# Patient Record
Sex: Female | Born: 1960 | Hispanic: Yes
Health system: Midwestern US, Community
[De-identification: ages and names within clinical notes are randomized; demographics above are authoritative.]

## PROBLEM LIST (undated history)

## (undated) DIAGNOSIS — Z1231 Encounter for screening mammogram for malignant neoplasm of breast: Secondary | ICD-10-CM

## (undated) DIAGNOSIS — Z1239 Encounter for other screening for malignant neoplasm of breast: Secondary | ICD-10-CM

## (undated) DIAGNOSIS — M899 Disorder of bone, unspecified: Secondary | ICD-10-CM

## (undated) DIAGNOSIS — M25511 Pain in right shoulder: Secondary | ICD-10-CM

## (undated) DIAGNOSIS — M2550 Pain in unspecified joint: Secondary | ICD-10-CM

## (undated) DIAGNOSIS — M654 Radial styloid tenosynovitis [de Quervain]: Secondary | ICD-10-CM

## (undated) DIAGNOSIS — G5601 Carpal tunnel syndrome, right upper limb: Secondary | ICD-10-CM

## (undated) DIAGNOSIS — R635 Abnormal weight gain: Secondary | ICD-10-CM

## (undated) DIAGNOSIS — M545 Low back pain, unspecified: Secondary | ICD-10-CM

## (undated) DIAGNOSIS — M24811 Other specific joint derangements of right shoulder, not elsewhere classified: Secondary | ICD-10-CM

## (undated) DIAGNOSIS — N6310 Unspecified lump in the right breast, unspecified quadrant: Secondary | ICD-10-CM

## (undated) DIAGNOSIS — R609 Edema, unspecified: Secondary | ICD-10-CM

---

## 2008-05-16 LAB — CHLAMYDIA/GC PCR
Chlamydia amplified: NEGATIVE
N. gonorrhea, amplified: NEGATIVE

## 2008-07-25 LAB — CHLAMYDIA/GC PCR
Chlamydia amplified: NEGATIVE
N. gonorrhea, amplified: NEGATIVE

## 2008-07-26 LAB — HSV CULTURE WITHOUT TYPING

## 2009-07-04 LAB — CULTURE, URINE
Culture result:: <10000
Culture: <10000

## 2009-07-18 LAB — CULTURE, URINE
Culture result:: 75000
Culture: 75000

## 2009-12-31 LAB — LIPID PANEL
CHOL/HDL Ratio: 2.6 (ref 0–5.0)
Cholesterol, total: 227 MG/DL — ABNORMAL HIGH (ref 0–200)
HDL Cholesterol: 86 MG/DL — ABNORMAL HIGH (ref 40–60)
LDL, calculated: 134.2 MG/DL — ABNORMAL HIGH (ref 0–100)
Triglyceride: 34 MG/DL (ref 0–150)
VLDL, calculated: 6.8 MG/DL

## 2010-12-24 LAB — LIPID PANEL
CHOL/HDL Ratio: 3.1 (ref 0–5.0)
Cholesterol, total: 203 MG/DL — ABNORMAL HIGH (ref 0–200)
HDL Cholesterol: 66 MG/DL — ABNORMAL HIGH (ref 40–60)
LDL, calculated: 125.6 MG/DL — ABNORMAL HIGH (ref 0–100)
Triglyceride: 57 MG/DL (ref 0–150)
VLDL, calculated: 11.4 MG/DL

## 2010-12-24 LAB — METABOLIC PANEL, COMPREHENSIVE
A-G Ratio: 1.1 (ref 0.8–1.7)
ALT (SGPT): 31 U/L (ref 30–65)
AST (SGOT): 12 U/L — ABNORMAL LOW (ref 15–37)
Albumin: 3.7 g/dL (ref 3.4–5.0)
Alk. phosphatase: 85 U/L (ref 50–136)
Anion gap: 6 mmol/L (ref 5–15)
BUN/Creatinine ratio: 13 (ref 12–20)
BUN: 9 MG/DL (ref 7–18)
Bilirubin, total: 0.8 MG/DL (ref 0.2–1.0)
CO2: 27 MMOL/L (ref 21–32)
Calcium: 8.9 MG/DL (ref 8.4–10.4)
Chloride: 107 MMOL/L (ref 100–108)
Creatinine: 0.7 MG/DL (ref 0.6–1.3)
GFR est AA: >60 mL/min/{1.73_m2} (ref >60)
GFR est non-AA: >60 mL/min/{1.73_m2} (ref >60)
Globulin: 3.4 g/dL (ref 2.0–4.0)
Glucose: 90 MG/DL (ref 74–99)
Potassium: 4.4 MMOL/L (ref 3.5–5.5)
Protein, total: 7.1 g/dL (ref 6.4–8.2)
Sodium: 140 MMOL/L (ref 136–145)

## 2010-12-24 LAB — TSH 3RD GENERATION: TSH: 0.51 u[IU]/mL (ref 0.51–6.27)

## 2010-12-24 LAB — CK: CK: 77 U/L (ref 26–192)

## 2013-08-03 ENCOUNTER — Encounter

## 2014-12-20 ENCOUNTER — Inpatient Hospital Stay: Admit: 2014-12-20 | Primary: Internal Medicine

## 2014-12-20 LAB — LIPID PANEL
CHOL/HDL Ratio: 2.9 Ratio (ref 0.0–4.4)
Cholesterol, total: 203 mg/dl — ABNORMAL HIGH (ref 140–199)
HDL Cholesterol: 70 mg/dl (ref 40–96)
LDL, calculated: 122 mg/dl (ref 0–130)
Triglyceride: 54 mg/dl (ref 29–150)

## 2014-12-20 LAB — GLUCOSE, RANDOM: Glucose: 83 mg/dl (ref 74–106)

## 2015-03-28 ENCOUNTER — Inpatient Hospital Stay: Payer: BLUE CROSS/BLUE SHIELD

## 2015-03-28 MED ORDER — FENTANYL CITRATE (PF) 50 MCG/ML IJ SOLN
50 mcg/mL | INTRAMUSCULAR | Status: DC | PRN
Start: 2015-03-28 — End: 2015-03-28

## 2015-03-28 MED ORDER — FENTANYL CITRATE (PF) 50 MCG/ML IJ SOLN
50 mcg/mL | INTRAMUSCULAR | Status: DC | PRN
Start: 2015-03-28 — End: 2015-03-28
  Administered 2015-03-28 (×2): via INTRAVENOUS

## 2015-03-28 MED ORDER — MEPERIDINE (PF) 25 MG/ML INJ SOLUTION
25 mg/ml | INTRAMUSCULAR | Status: DC | PRN
Start: 2015-03-28 — End: 2015-03-28

## 2015-03-28 MED ORDER — HYDROCODONE-ACETAMINOPHEN 5 MG-325 MG TAB
5-325 mg | ORAL | Status: DC | PRN
Start: 2015-03-28 — End: 2015-03-28

## 2015-03-28 MED ORDER — EPHEDRINE SULFATE 50 MG/ML IJ SOLN
50 mg/mL | INTRAMUSCULAR | Status: DC | PRN
Start: 2015-03-28 — End: 2015-03-28
  Administered 2015-03-28: 12:00:00 via INTRAVENOUS

## 2015-03-28 MED ORDER — OXYCODONE 5 MG TAB
5 mg | ORAL | Status: DC | PRN
Start: 2015-03-28 — End: 2015-03-28

## 2015-03-28 MED ORDER — LABETALOL 5 MG/ML IV SOLN
5 mg/mL | INTRAVENOUS | Status: DC | PRN
Start: 2015-03-28 — End: 2015-03-28

## 2015-03-28 MED ORDER — PROMETHAZINE 25 MG/ML INJECTION
25 mg/mL | INTRAMUSCULAR | Status: DC | PRN
Start: 2015-03-28 — End: 2015-03-28

## 2015-03-28 MED ORDER — OXYCODONE-ACETAMINOPHEN 5 MG-325 MG TAB
5-325 mg | ORAL | Status: DC | PRN
Start: 2015-03-28 — End: 2015-03-28
  Administered 2015-03-28: 13:00:00 via ORAL

## 2015-03-28 MED ORDER — HYDRALAZINE 20 MG/ML IJ SOLN
20 mg/mL | INTRAMUSCULAR | Status: DC | PRN
Start: 2015-03-28 — End: 2015-03-28

## 2015-03-28 MED ORDER — ONDANSETRON (PF) 4 MG/2 ML INJECTION
4 mg/2 mL | INTRAMUSCULAR | Status: DC | PRN
Start: 2015-03-28 — End: 2015-03-28
  Administered 2015-03-28: 12:00:00 via INTRAVENOUS

## 2015-03-28 MED ORDER — ROPIVACAINE (PF) 5 MG/ML (0.5 %) INJECTION
5 mg/mL (0. %) | INTRAMUSCULAR | Status: DC | PRN
Start: 2015-03-28 — End: 2015-03-28
  Administered 2015-03-28: 12:00:00

## 2015-03-28 MED ORDER — LACTATED RINGERS IV
INTRAVENOUS | Status: DC
Start: 2015-03-28 — End: 2015-03-28
  Administered 2015-03-28 (×2): via INTRAVENOUS

## 2015-03-28 MED ORDER — HYDROMORPHONE (PF) 1 MG/ML IJ SOLN
1 mg/mL | INTRAMUSCULAR | Status: DC | PRN
Start: 2015-03-28 — End: 2015-03-28

## 2015-03-28 MED ORDER — PROPOFOL 10 MG/ML IV EMUL
10 mg/mL | INTRAVENOUS | Status: DC | PRN
Start: 2015-03-28 — End: 2015-03-28
  Administered 2015-03-28: 12:00:00 via INTRAVENOUS

## 2015-03-28 MED FILL — LABETALOL 5 MG/ML IV SOLN: 5 mg/mL | INTRAVENOUS | Qty: 2

## 2015-03-28 MED FILL — HYDROMORPHONE (PF) 1 MG/ML IJ SOLN: 1 mg/mL | INTRAMUSCULAR | Qty: 0.5

## 2015-03-28 MED FILL — HYDROCODONE-ACETAMINOPHEN 5 MG-325 MG TAB: 5-325 mg | ORAL | Qty: 1

## 2015-03-28 MED FILL — FENTANYL CITRATE (PF) 50 MCG/ML IJ SOLN: 50 mcg/mL | INTRAMUSCULAR | Qty: 0.5

## 2015-03-28 MED FILL — OXYCODONE 5 MG TAB: 5 mg | ORAL | Qty: 1

## 2015-03-28 MED FILL — LACTATED RINGERS IV: INTRAVENOUS | Qty: 1000

## 2015-03-28 MED FILL — OXYCODONE-ACETAMINOPHEN 5 MG-325 MG TAB: 5-325 mg | ORAL | Qty: 1

## 2015-03-28 MED FILL — DEMEROL (PF) 25 MG/ML INJECTION SYRINGE: 25 mg/mL | INTRAMUSCULAR | Qty: 1

## 2015-03-28 MED FILL — HYDRALAZINE 20 MG/ML IJ SOLN: 20 mg/mL | INTRAMUSCULAR | Qty: 0.25

## 2015-03-28 MED FILL — PROMETHAZINE 25 MG/ML INJECTION: 25 mg/mL | INTRAMUSCULAR | Qty: 0.25

## 2015-03-28 NOTE — Anesthesia Pre-Procedure Evaluation (Signed)
Anesthetic History   No history of anesthetic complications            Review of Systems / Medical History  Patient summary reviewed, nursing notes reviewed and pertinent labs reviewed    Pulmonary  Within defined limits                 Neuro/Psych   Within defined limits           Cardiovascular  Within defined limits                     GI/Hepatic/Renal  Within defined limits              Endo/Other  Within defined limits           Other Findings              Physical Exam    Airway  Mallampati: I    Neck ROM: normal range of motion   Mouth opening: Normal     Cardiovascular  Regular rate and rhythm,  S1 and S2 normal,  no murmur, click, rub, or gallop             Dental  No notable dental hx       Pulmonary  Breath sounds clear to auscultation               Abdominal  GI exam deferred       Other Findings            Anesthetic Plan    ASA: 1  Anesthesia type: general          Induction: Intravenous  Anesthetic plan and risks discussed with: Patient

## 2015-03-28 NOTE — Brief Op Note (Signed)
BRIEF OPERATIVE NOTE    Date of Procedure: 03/28/2015   Preoperative Diagnosis: Worsening Bilateral Carpal Tunnel Syndrome   Postoperative Diagnosis: Worsening Bilateral Carpal Tunnel Syndrome     Procedure(s):  BILATERAL CARPAL TUNNEL RELEASE  Surgeon(s) and Role:     Carter Kitten., MD - Primary            Surgical Staff:  Circ-1: Manley Mason. Pati Gallo, RN  Scrub Tech-1: Joaquim Lai  Surgical Tech First Assistant: Everlene Balls  Event Time In   Incision Start [phone]   Incision Close      Anesthesia: General   Estimated Blood Loss:minimal  Specimens: none   Complications:none  Implants:

## 2015-03-28 NOTE — Op Note (Signed)
[facility]  Ambulatory Surgery  NAME:  Katherine Keller, Katherine Keller  SEX:   F  SURGERY DATE: 03/28/2015  DOB: 1961-01-11  MR#    [id number]  LOCATION: PACU  ACCT#  [id number]        PREOPERATIVE DIAGNOSIS:  Steadily worsening bilateral carpal tunnel syndrome.    POSTOPERATIVE DIAGNOSIS:  Steadily worsening bilateral carpal tunnel syndrome.    OPERATION PERFORMED:  Bilateral carpal tunnel release with open technique.    SURGEON:  Venetia Constable, M.D.    ANESTHESIA:  General.    SKIN PREPARATION:  Betadine.    DESCRIPTION OF PROCEDURE:  With the patient in the supine position under satisfactory level of general   anesthesia, both upper extremities were prepped and draped as separate sterile   fields.  Standard time-out protocol was observed and all in attendance agree   with the operative plan.  Accordingly, both wrists injected with 0.5%   ropivacaine for postoperative analgesia.  Bilateral palmar incisions are made   2.5 cm in length with a #15 blade scalpel.  Dissection is carried out with 3.5   power loupe magnification to disclose the distal margin of the transverse   carpal ligament.  This ligament is dissected with Freer periosteal dissector   to release it from overlying fascia.  The Paganelli knife is now inserted and   its notch, engaged in the transverse carpal ligament with the guard resting   over the median nerve complex.  The Paganelli knife is now advanced   proximally, and audible and palpable release of the transverse carpal ligament   is noted.  Tourniquet was released and meticulous hemostasis obtained with   bipolar cautery.  Incisions closed with interrupted 3-0 nylon, bulky sterile   dressing applied.  The patient is now awakened for transport to the recovery   room in good condition.  There are no apparent complications.  Estimated blood   loss was minimal.  There are no implants and no specimens.      ___________________  Colleen Can MD  Dictated By:.   NS  D:03/28/2015 08:46:00   T: 03/28/2015 10:23:48  [id number]

## 2015-03-28 NOTE — Anesthesia Post-Procedure Evaluation (Signed)
Post-Anesthesia Evaluation and Assessment    Patient: Katherine Keller MRN: [id number]  SSN: [id number]    Date of Birth: 1961-01-20  Age: 54 y.o.  Sex: female       Cardiovascular Function/Vital Signs  Visit Vitals   ??? BP 95/51   ??? Pulse 81   ??? Temp 36.6 ??C (97.8 ??F)   ??? Resp 16   ??? Ht  (1.626 m)   ??? Wt 81.6 kg (180 lb)   ??? SpO2 94%   ??? BMI 30.9 kg/m2       Patient is status post general anesthesia for Procedure(s):  BILATERAL CARPAL TUNNEL RELEASE.    Nausea/Vomiting: None    Postoperative hydration reviewed and adequate.    Pain:  Pain Scale 1: Numeric (0 - 10) (03/28/15 0655)  Pain Intensity 1: 6 (03/28/15 2841)   Managed    Neurological Status:       At baseline    Mental Status and Level of Consciousness: Arousable    Pulmonary Status:   O2 Device: Room air (03/28/15 3244)   Adequate oxygenation and airway patent    Complications related to anesthesia: None    Post-anesthesia assessment completed. No concerns    Signed By: Thelma Comp, MD     March 28, 2015

## 2015-03-28 NOTE — Op Note (Signed)
[facility]  Ambulatory Surgery  NAME:  Katherine Keller, Katherine Keller  SEX:   F  SURGERY DATE: 03/28/2015  DOB: [birth year]  MR#    [id number]  LOCATION: PACU  ACCT#  [id number]        PREOPERATIVE DIAGNOSIS:  Steadily worsening bilateral carpal tunnel syndrome.    POSTOPERATIVE DIAGNOSIS:  Steadily worsening bilateral carpal tunnel syndrome.    OPERATION PERFORMED:  Bilateral carpal tunnel release with open technique.    SURGEON:  Venetia Constable, M.D.    ANESTHESIA:  General.    SKIN PREPARATION:  Betadine.    DESCRIPTION OF PROCEDURE:  With the patient in the supine position under satisfactory level of general   anesthesia, both upper extremities were prepped and draped as separate sterile   fields.  Standard time-out protocol was observed and all in attendance agree   with the operative plan.  Accordingly, both wrists injected with 0.5%   ropivacaine for postoperative analgesia.  Bilateral palmar incisions are made   2.5 cm in length with a #15 blade scalpel.  Dissection is carried out with 3.5   power loupe magnification to disclose the distal margin of the transverse   carpal ligament.  This ligament is dissected with Freer periosteal dissector   to release it from overlying fascia.  The Paganelli knife is now inserted and   its notch, engaged in the transverse carpal ligament with the guard resting   over the median nerve complex.  The Paganelli knife is now advanced   proximally, and audible and palpable release of the transverse carpal ligament   is noted.  Tourniquet was released and meticulous hemostasis obtained with   bipolar cautery.  Incisions closed with interrupted 3-0 nylon, bulky sterile   dressing applied.  The patient is now awakened for transport to the recovery   room in good condition.  There are no apparent complications.  Estimated blood   loss was minimal.  There are no implants and no specimens.      ___________________  Colleen Can MD  Dictated By:.   NS  D:03/28/2015  08:46:00  T: 03/28/2015 10:23:48  [id number]

## 2015-05-01 ENCOUNTER — Encounter

## 2015-05-01 ENCOUNTER — Inpatient Hospital Stay: Admit: 2015-05-01 | Payer: BLUE CROSS/BLUE SHIELD | Primary: Internal Medicine

## 2015-05-01 DIAGNOSIS — Z1231 Encounter for screening mammogram for malignant neoplasm of breast: Secondary | ICD-10-CM

## 2015-12-24 ENCOUNTER — Inpatient Hospital Stay: Admit: 2015-12-24 | Payer: BLUE CROSS/BLUE SHIELD | Primary: Internal Medicine

## 2015-12-24 ENCOUNTER — Inpatient Hospital Stay: Admit: 2015-12-24 | Primary: Internal Medicine

## 2015-12-24 DIAGNOSIS — Z0000 Encounter for general adult medical examination without abnormal findings: Secondary | ICD-10-CM

## 2015-12-24 LAB — METABOLIC PANEL, COMPREHENSIVE
ALT (SGPT): 23 U/L (ref 12–78)
AST (SGOT): 15 U/L (ref 15–37)
Albumin: 3.8 gm/dl (ref 3.4–5.0)
Alk. phosphatase: 70 U/L (ref 45–117)
BUN: 13 mg/dl (ref 7–25)
Bilirubin, total: 1 mg/dl (ref 0.2–1.0)
CO2: 28 mEq/L (ref 21–32)
Calcium: 8.9 mg/dl (ref 8.5–10.1)
Chloride: 105 mEq/L (ref 98–107)
Creatinine: 0.6 mg/dl (ref 0.6–1.3)
GFR est AA: >60
GFR est non-AA: >60
Glucose: 71 mg/dl — ABNORMAL LOW (ref 74–106)
Potassium: 4 mEq/L (ref 3.5–5.1)
Protein, total: 7.2 gm/dl (ref 6.4–8.2)
Sodium: 143 mEq/L (ref 136–145)

## 2015-12-24 LAB — CBC WITH AUTOMATED DIFF
BASOPHILS: 0.4 % (ref 0–3)
EOSINOPHILS: 2 % (ref 0–5)
HCT: 40.3 % (ref 37.0–50.0)
HGB: 12.5 gm/dl — ABNORMAL LOW (ref 13.0–17.2)
IMMATURE GRANULOCYTES: 0.4 % (ref 0.0–3.0)
LYMPHOCYTES: 18.7 % — ABNORMAL LOW (ref 28–48)
MCH: 25.6 pg (ref 25.4–34.6)
MCHC: 31 gm/dl (ref 30.0–36.0)
MCV: 82.6 fL (ref 80.0–98.0)
MONOCYTES: 6.8 % (ref 1–13)
MPV: 10.9 fL — ABNORMAL HIGH (ref 6.0–10.0)
NEUTROPHILS: 71.7 % — ABNORMAL HIGH (ref 34–64)
NRBC: 0 (ref 0–0)
PLATELET: 297 10*3/uL (ref 140–450)
RBC: 4.88 M/uL (ref 3.60–5.20)
RDW-SD: 39.8 (ref 36.4–46.3)
WBC: 7.6 10*3/uL (ref 4.0–11.0)

## 2015-12-24 LAB — LIPID PANEL
CHOL/HDL Ratio: 2.4 Ratio (ref 0.0–4.4)
Cholesterol, total: 189 mg/dl (ref 140–199)
HDL Cholesterol: 79 mg/dl (ref 40–96)
LDL, calculated: 101 mg/dl (ref 0–130)
Triglyceride: 47 mg/dl (ref 29–150)

## 2015-12-24 LAB — TSH 3RD GENERATION: TSH: 1.61 u[IU]/mL (ref 0.358–3.740)

## 2015-12-24 LAB — VITAMIN D, 25 HYDROXY: Vitamin D, 25-OH, Total: 29.7 ng/ml — ABNORMAL LOW (ref 30.0–100.0)

## 2015-12-24 LAB — HEMOGLOBIN A1C W/O EAG: Hemoglobin A1c: 5.6 % (ref 4.8–6.0)

## 2016-09-07 ENCOUNTER — Inpatient Hospital Stay: Admit: 2016-09-07 | Payer: BLUE CROSS/BLUE SHIELD | Primary: Internal Medicine

## 2016-09-07 ENCOUNTER — Encounter

## 2016-09-07 DIAGNOSIS — Z1231 Encounter for screening mammogram for malignant neoplasm of breast: Secondary | ICD-10-CM

## 2016-12-16 ENCOUNTER — Inpatient Hospital Stay: Admit: 2016-12-16 | Primary: Internal Medicine

## 2016-12-16 LAB — LIPID PANEL
CHOL/HDL Ratio: 2.7 Ratio (ref 0.0–4.4)
Cholesterol, total: 199 mg/dl (ref 140–199)
HDL Cholesterol: 74 mg/dl (ref 40–96)
LDL, calculated: 114 mg/dl (ref 0–130)
Triglyceride: 55 mg/dl (ref 29–150)

## 2016-12-16 LAB — HEMOGLOBIN A1C W/O EAG: Hemoglobin A1c: 5.7 % (ref 4.2–6.3)

## 2017-01-20 ENCOUNTER — Inpatient Hospital Stay: Admit: 2017-01-20 | Payer: BLUE CROSS/BLUE SHIELD | Attending: Internal Medicine | Primary: Internal Medicine

## 2017-01-20 ENCOUNTER — Encounter

## 2017-01-20 ENCOUNTER — Inpatient Hospital Stay: Admit: 2017-01-20 | Discharge: 2017-01-20 | Payer: BLUE CROSS/BLUE SHIELD | Primary: Internal Medicine

## 2017-01-20 DIAGNOSIS — M545 Low back pain, unspecified: Secondary | ICD-10-CM

## 2017-01-20 LAB — METABOLIC PANEL, COMPREHENSIVE
ALT (SGPT): 25 U/L (ref 12–78)
AST (SGOT): 17 U/L (ref 15–37)
Albumin: 3.9 gm/dl (ref 3.4–5.0)
Alk. phosphatase: 83 U/L (ref 45–117)
Anion gap: 8 mmol/L (ref 5–15)
BUN: 19 mg/dl (ref 7–25)
Bilirubin, total: 1 mg/dl (ref 0.2–1.0)
CO2: 26 mEq/L (ref 21–32)
Calcium: 9.2 mg/dl (ref 8.5–10.1)
Chloride: 108 mEq/L — ABNORMAL HIGH (ref 98–107)
Creatinine: 0.9 mg/dl (ref 0.6–1.3)
GFR est AA: >60
GFR est non-AA: >60
Glucose: 88 mg/dl (ref 74–106)
Potassium: 4 mEq/L (ref 3.5–5.1)
Protein, total: 7.7 gm/dl (ref 6.4–8.2)
Sodium: 142 mEq/L (ref 136–145)

## 2017-01-20 LAB — CBC WITH AUTOMATED DIFF
BASOPHILS: 0.4 % (ref 0–3)
EOSINOPHILS: 2.5 % (ref 0–5)
HCT: 39.6 % (ref 37.0–50.0)
HGB: 12.6 gm/dl — ABNORMAL LOW (ref 13.0–17.2)
IMMATURE GRANULOCYTES: 0.2 % (ref 0.0–3.0)
LYMPHOCYTES: 38.1 % (ref 28–48)
MCH: 25.6 pg (ref 25.4–34.6)
MCHC: 31.8 gm/dl (ref 30.0–36.0)
MCV: 80.5 fL (ref 80.0–98.0)
MONOCYTES: 7.8 % (ref 1–13)
MPV: 10.7 fL — ABNORMAL HIGH (ref 6.0–10.0)
NEUTROPHILS: 51 % (ref 34–64)
NRBC: 0 (ref 0–0)
PLATELET: 352 10*3/uL (ref 140–450)
RBC: 4.92 M/uL (ref 3.60–5.20)
RDW-SD: 42.2 (ref 36.4–46.3)
WBC: 5.6 10*3/uL (ref 4.0–11.0)

## 2017-01-20 LAB — URINALYSIS W/ RFLX MICROSCOPIC
Bilirubin: NEGATIVE
Blood: NEGATIVE
Glucose: NEGATIVE mg/dl
Ketone: NEGATIVE mg/dl
Leukocyte Esterase: NEGATIVE
Nitrites: NEGATIVE
Protein: NEGATIVE mg/dl
Specific gravity: >=1.03 (ref 1.005–1.030)
Urobilinogen: 0.2 mg/dl (ref 0.0–1.0)
pH (UA): 5.5 (ref 5.0–9.0)

## 2017-01-20 LAB — LIPID PANEL
CHOL/HDL Ratio: 3.1 Ratio (ref 0.0–4.4)
Cholesterol, total: 225 mg/dl — ABNORMAL HIGH (ref 140–199)
HDL Cholesterol: 73 mg/dl (ref 40–96)
LDL, calculated: 117 mg/dl (ref 0–130)
Triglyceride: 176 mg/dl — ABNORMAL HIGH (ref 29–150)

## 2017-01-20 LAB — TSH 3RD GENERATION: TSH: 0.465 u[IU]/mL (ref 0.358–3.740)

## 2017-01-20 LAB — T4, FREE: Free T4: 0.84 ng/dl (ref 0.76–1.46)

## 2017-01-20 LAB — VITAMIN D, 25 HYDROXY: Vitamin D, 25-OH, Total: 25.9 ng/ml — ABNORMAL LOW (ref 30.0–100.0)

## 2017-01-20 LAB — HEMOGLOBIN A1C W/O EAG: Hemoglobin A1c: 5.6 % (ref 4.2–6.3)

## 2017-04-27 ENCOUNTER — Inpatient Hospital Stay: Admit: 2017-04-27 | Discharge: 2017-04-27 | Payer: BLUE CROSS/BLUE SHIELD | Primary: Internal Medicine

## 2017-04-27 DIAGNOSIS — M899 Disorder of bone, unspecified: Secondary | ICD-10-CM

## 2017-04-27 LAB — CBC WITH AUTOMATED DIFF
BASOPHILS: 0.5 % (ref 0–3)
EOSINOPHILS: 1.8 % (ref 0–5)
HCT: 38.5 % (ref 37.0–50.0)
HGB: 12.2 gm/dl — ABNORMAL LOW (ref 13.0–17.2)
IMMATURE GRANULOCYTES: 0.3 % (ref 0.0–3.0)
LYMPHOCYTES: 35 % (ref 28–48)
MCH: 25.4 pg (ref 25.4–34.6)
MCHC: 31.7 gm/dl (ref 30.0–36.0)
MCV: 80.2 fL (ref 80.0–98.0)
MONOCYTES: 7.9 % (ref 1–13)
MPV: 10.5 fL — ABNORMAL HIGH (ref 6.0–10.0)
NEUTROPHILS: 54.5 % (ref 34–64)
NRBC: 0 (ref 0–0)
PLATELET: 327 10*3/uL (ref 140–450)
RBC: 4.8 M/uL (ref 3.60–5.20)
RDW-SD: 39.8 (ref 36.4–46.3)
WBC: 6.1 10*3/uL (ref 4.0–11.0)

## 2017-04-28 ENCOUNTER — Encounter

## 2017-04-28 LAB — URINALYSIS W/ RFLX MICROSCOPIC
Bilirubin: NEGATIVE
Blood: NEGATIVE
Glucose: NEGATIVE mg/dl
Ketone: NEGATIVE mg/dl
Leukocyte Esterase: NEGATIVE
Nitrites: NEGATIVE
Protein: NEGATIVE mg/dl
Specific gravity: 1.015 (ref 1.005–1.030)
Urobilinogen: 0.2 mg/dl (ref 0.0–1.0)
pH (UA): 6 (ref 5.0–9.0)

## 2017-04-28 LAB — METABOLIC PANEL, COMPREHENSIVE
ALT (SGPT): 22 U/L (ref 12–78)
AST (SGOT): 12 U/L — ABNORMAL LOW (ref 15–37)
Albumin: 3.7 gm/dl (ref 3.4–5.0)
Alk. phosphatase: 83 U/L (ref 45–117)
Anion gap: 7 mmol/L (ref 5–15)
BUN: 20 mg/dl (ref 7–25)
Bilirubin, total: 0.5 mg/dl (ref 0.2–1.0)
CO2: 26 mEq/L (ref 21–32)
Calcium: 9.2 mg/dl (ref 8.5–10.1)
Chloride: 106 mEq/L (ref 98–107)
Creatinine: 0.6 mg/dl (ref 0.6–1.3)
GFR est AA: >60
GFR est non-AA: >60
Glucose: 82 mg/dl (ref 74–106)
Potassium: 3.6 mEq/L (ref 3.5–5.1)
Protein, total: 7.2 gm/dl (ref 6.4–8.2)
Sodium: 139 mEq/L (ref 136–145)

## 2017-04-28 LAB — SED RATE (ESR): Sed rate (ESR): 10 mm/Hr (ref 0–30)

## 2017-04-28 LAB — C REACTIVE PROTEIN, QT: C-Reactive protein: 3.3 mg/L — ABNORMAL HIGH (ref 0.0–2.9)

## 2017-05-01 LAB — PROTEIN ELECTROPHORESIS, URINE RANDOM
Abnormal Protein Band 1: NOT DETECTED
Albumin, urine: 100 %
Alpha-1-Globulin, urine: 0 %
Alpha-2-Globulin, urine: 0 %
Beta-globulin, urine: 0 %
Creatinine, urine random: 39 mg/dL (ref 20–275)
Gamma Globulin, urine: 0 %
Protein, urine random: 4 mg/dL — ABNORMAL LOW (ref 5–24)
Protein/Creat. urine Ratio: 103 mg/g creat (ref 21–161)

## 2017-05-01 LAB — PROTEIN ELECTROPHORESIS
ALPHA-2 GLOBULIN: 0.6 g/dL (ref 0.5–0.9)
Abnormal Protein Band 1: NOT DETECTED
Albumin: 3.9 g/dL (ref 3.8–4.8)
Alpha-1-globulin: 0.3 g/dL (ref 0.2–0.3)
Beta 1 Globulin: 0.5 g/dL (ref 0.4–0.6)
Beta 2 Globulin: 0.3 g/dL (ref 0.2–0.5)
Gamma globulin: 1 g/dL (ref 0.8–1.7)
Interpretation (see below): NORMAL
Protein, total: 6.6 g/dL (ref 6.1–8.1)

## 2017-05-03 ENCOUNTER — Inpatient Hospital Stay: Admit: 2017-05-03 | Payer: BLUE CROSS/BLUE SHIELD | Attending: Orthopaedic Surgery | Primary: Internal Medicine

## 2017-05-03 ENCOUNTER — Encounter

## 2017-05-03 DIAGNOSIS — M899 Disorder of bone, unspecified: Secondary | ICD-10-CM

## 2017-05-03 MED ORDER — GADOPENTETATE DIMEGLUMINE 469.01 MG/ML (46.9 %) IV SOLN
469.01 mg/mL (46.9 %) | Freq: Once | INTRAVENOUS | Status: AC
Start: 2017-05-03 — End: 2017-05-03
  Administered 2017-05-03: 15:00:00 via INTRAVENOUS

## 2017-05-03 MED ORDER — TECHNETIUM TC 99M MEDRONATE IV KIT
Freq: Once | Status: AC
Start: 2017-05-03 — End: 2017-05-03
  Administered 2017-05-03: 14:00:00 via INTRAVENOUS

## 2017-05-03 MED FILL — MAGNEVIST 469.01 MG/ML (46.9 %) INTRAVENOUS SOLUTION: 469.01 mg/mL (46.9 %) | INTRAVENOUS | Qty: 18

## 2017-05-12 ENCOUNTER — Encounter: Primary: Internal Medicine

## 2017-05-18 ENCOUNTER — Inpatient Hospital Stay: Admit: 2017-05-18 | Payer: BLUE CROSS/BLUE SHIELD | Primary: Internal Medicine

## 2017-05-18 DIAGNOSIS — M25511 Pain in right shoulder: Secondary | ICD-10-CM

## 2017-05-18 NOTE — Progress Notes (Signed)
[address], [place]  [place], [place] [id number]  [phone]  __________________________________________________________  OUTPATIENT PHYSICAL THERAPYINITIAL EVALUATION    Patient: Katherine DikeRosalinda Keller       Age: 56 y.o.        DOB: 1961/04/08        MRN: [id number]  Date: 05/18/2017      Referring Physician: Neomia Glassatherine Rapp,  MD   Diagnosis: Impingement syndrome of unspecified shoulder [M75.40]   Treatment Diagnosis: Right Shoulder Pain   Plan of Care:  Theraputic Exercise, Moist Heat/Cryotherapy, Iontophoresis and Teaching of a HEP    Frequency/Duration: 2 times/week for 6 weeks for a total of 12 visits     I certify the need for these services furnished under this plan of treatment and while under my care.         Signature: _________________________________ Date: _________________               Neomia Glassatherine Rapp, MD       PLEASE SIGN AND RETURN VIA FAX TO [phone]     SUBJECTIVE:   History of present illness: Patient reports a gradual onset of right shoulder pain beginning in June. She denies any specific trauma or injury to her shoulder. Pain has become constant and increases with overhead reaching.   Pain Scale Rating:  5/10  Medications: Reviewed in chart.  Allergies: Sulfa  Precautions:  None stated on the prescription.  Past Medical History: Unremarkable  Occupation/Social History: Dietary  Previous Physical Therapy:  None reported  Patient's Goal:  Decrease pain    OBJECTIVE:   Range of Motion: Right shoulder AROM currently measures: 146 degrees of flexion, 128 degrees of abduction, 55 degrees of internal rotation and 68 degrees of external rotation. All motion is limited by pain and guarding..  Strength:  Right: RTC 4-/5, Deltoids 4/5, Biceps 4+/5, Triceps 4+/5    Girth: Absent distal upper extremity swelling.    Palpation: Tenderness is present with palpation over the posterior aspect of the right shoulder, rated 1-2/4 on a myofascial tender point index.     Posture: Right shoulder girdle slightly elevated.     Sensation: Intact to gross touch distal upper extremity dermatomes.    Special Tests: Impingement Tests (+), Drop Arm/Empty Can (-)    TODAY'S TREATMENT:     Evaluation followed by: Theraputic Exercise, Moist Heat and Teaching of a HEP to Decrease Pain, Increase Soft Tissue Extensibility, Increase Strength and Improve ROM    Patient educated on the purpose of Physical Therapy and the plan of care as it pertains to her condition.  ASSESSMENT:     Patient presents with right shoulder pain limiting ROM, strength and functional use of the right upper extremity.  GOALS:      To be achieved in 12 visits.   1. Full pain-free right shoulder AROM.   2. 5/5 right upper extremity strength to allow for return to daily activities.   3. Absent tenderness throughout the right shoulder girdle.   4. Demonstrate improved postural awareness.   5. Patient to be independent with their HEP.  Rehab Potential: Good to meet the above stated goals.    Barriers to achieving goals: None    PLAN:     Skilled Physical Therapy services will modify and progress therapeutic interventions, address functional deficits, address ROM and strength deficits, analyze and address soft tissue restrictions, analyze and cue movement patterns and assess and modify postural abnormalities to reach the stated goals.  Thank you for this referral.  Judeth Porch, PT.DPT

## 2017-05-25 ENCOUNTER — Encounter: Payer: BLUE CROSS/BLUE SHIELD | Primary: Internal Medicine

## 2017-06-28 NOTE — Progress Notes (Signed)
[address], [place]  [place], [place] [id number]  ______________________________________________________________________________________    06/28/2017    To: Neomia Glassatherine Rapp, MD  Re:   Katherine Keller    Katherine DikeRosalinda Keller was seen in our clinic on 05/18/2017 (01 visit) for evaluation and treatment of right shoulder pain.    Patient has not returned for her remaining visits and has been discharged from our services.    If you have any questions, please feel free to contact me at [phone].      Sincerely,      ____________________________  R. Bard HerbertMichael McCombs, PT, DPT

## 2017-07-28 ENCOUNTER — Encounter

## 2017-08-26 ENCOUNTER — Encounter

## 2017-09-12 ENCOUNTER — Inpatient Hospital Stay: Admit: 2017-09-12 | Payer: BLUE CROSS/BLUE SHIELD | Primary: Internal Medicine

## 2017-09-12 DIAGNOSIS — Z713 Dietary counseling and surveillance: Secondary | ICD-10-CM

## 2017-09-12 NOTE — Progress Notes (Signed)
Nutrition Counseling for Obesity       Ht:    65.0 in  Wt:   185.4 lb  Body mass index is 30.85 kg/m??.    Katherine Keller is a 57 y.o. female seen today for nutrition counseling for weight loss and healthy eating.      Past Medical History includes:  Past Medical History:   Diagnosis Date   ??? Arthritis      Current Medications:  Current Outpatient Medications   Medication Sig Dispense Refill   ??? black cohosh 540 mg cap Take  by mouth.     ??? cholecalciferol (VITAMIN D3) 1,000 unit tablet Take  by mouth daily.     ??? traMADol (ULTRAM) 50 mg tablet Take 50 mg by mouth two (2) times a day. Indications: PAIN          Katherine Keller is here for the [facility] program. She doesn't smoke or consumes alcohol.      Katherine Keller consumes most of her meals from the cafeteria here at the hospital. She will also eat out with the family when she is off in the evenings. She typically works 6 days per week. She will snack on fruit or Nutri Grain bars at home. Katherine Keller typically eats 2-3 meals and 2 snacks per day. Some days due to her working environment she grazes instead of having a meal.       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       Introduced Katherine Keller to the [employer]ible Meal Plan for Weight Loss and the Plate Method for Meal Planning and provided written materials.  Practiced planning meals using food models to demonstrate use of the Plate Method and to visualize appropriate serving sizes.   Food records were reviewed at today's visit and suggestions were made for improvement. Benefits of keeping detailed daily food records for long term weight loss success were discussed.      Katherine Keller is motivated to make lifestyle changes to improve her overall health and help her to lose weight.  She set several goals and these are attached for reference.       Katherine Keller was a pleasure to work with today.  She has our contact information for questions as needed and is scheduled for a follow-up visit in April     Katherine Keller Katherine Conley, MS, RDN, CSOWM, CDE    Katherine Keller's Weight Loss Goals:    Eat 3 meals per day, focusing on whole grains, fruits, vegetables, lean meats, healthy fats and low sodium foods.  Eliminate sugar sweetened beverages-check Vitamin Water  Limit intake of sugar and fat from snack foods-limit sweets and chips  Avoid fatty, fried, fast foods and processed foods  Reduce entree servings and add veggies at lunch and dinner  Increase intake fiber from whole grains  Increase intake of non-starchy vegetables  Increase intake of fresh fruit   Keep food logs for next 30 days to be discussed with dietitian on next visit  Add strength training 2-3 days per week

## 2017-10-10 ENCOUNTER — Inpatient Hospital Stay: Admit: 2017-10-10 | Payer: BLUE CROSS/BLUE SHIELD | Primary: Internal Medicine

## 2017-10-10 DIAGNOSIS — E669 Obesity, unspecified: Secondary | ICD-10-CM

## 2017-10-10 NOTE — Progress Notes (Addendum)
Nutrition Counseling for Obesity   Why Weight Visit 2 of 4    Ht:    65.0 in  Wt:   185.2 lb  Body mass index is 30.82 kg/m??.    Katherine Keller is a 57 y.o. female seen today for nutrition counseling for weight loss and healthy eating.      Past Medical History includes:  Past Medical History:   Diagnosis Date   ??? Arthritis          Current Medications:  Current Outpatient Medications   Medication Sig Dispense Refill   ??? naproxen (NAPROSYN) 250 mg tablet Take  by mouth as needed.     ??? black cohosh 540 mg cap Take  by mouth.     ??? cholecalciferol (VITAMIN D3) 1,000 unit tablet Take  by mouth daily.     ??? traMADol (ULTRAM) 50 mg tablet Take 50 mg by mouth two (2) times a day. Indications: PAIN             Katherine Keller returned today for a follow-up nutrition appointment. Her weight is stable over the last month.   She kept food records for the last 4 weeks. Unfortunately she had a virus that kept her in bed for 5-6 days which caused a set back.   Her diet has changed since her last visit. She has almost eliminated dessert type products and all of her beverages are zero calorie. She has also cut back on fast food.   Katherine Keller continues to skip meals and frequently eats large dinner meals due to her work schedule.   She admits to tremendous on the job stress and realizes skipping meals is not good for her health or weight loss.     We discussed meals and snacks that she can incorporate in to he current life schedule to prevent missed meals. We reviewed her food logs and made recommendations for improvements. Reviewed the Flexible Meal Plan for Weight Loss and provided written material.      She will return to exercising when she has her energy back after the virus.    Katherine Keller continues to be motivated to make lifestyle changes to improve her overall health and help her to lose weight.  We reviewed her previously set goals and these are attached for reference.       Katherine Keller was a pleasure to work with today.  She has our contact information for questions as needed and is scheduled for a follow-up visit in 4 weeks.     Katherine Barbararacy Hatfield Darrio Bade, MS, RDN, CSOWM, CDE    Katherine Keller's Weight Loss Goals:  ??  Eat 3 meals per day, focusing on whole grains, fruits, vegetables, lean meats, healthy fats and low sodium foods.  Eliminate sugar sweetened beverages-check Vitamin Water  Limit intake of sugar and fat from snack foods-limit sweets and chips  Avoid fatty, fried, fast foods and processed foods  Reduce entree servings and add veggies at lunch and dinner  Increase intake fiber from whole grains  Increase intake of non-starchy vegetables  Increase intake of fresh fruit   Keep food logs for next 30 days to be discussed with dietitian on next visit  Add strength training 2-3 days per week

## 2017-11-14 ENCOUNTER — Inpatient Hospital Stay: Payer: BLUE CROSS/BLUE SHIELD | Primary: Internal Medicine

## 2017-11-24 ENCOUNTER — Encounter

## 2017-11-24 ENCOUNTER — Inpatient Hospital Stay: Admit: 2017-11-24 | Discharge: 2017-11-24 | Payer: BLUE CROSS/BLUE SHIELD | Primary: Internal Medicine

## 2017-11-24 ENCOUNTER — Inpatient Hospital Stay: Admit: 2017-11-24 | Payer: BLUE CROSS/BLUE SHIELD | Primary: Internal Medicine

## 2017-11-24 DIAGNOSIS — Z1231 Encounter for screening mammogram for malignant neoplasm of breast: Secondary | ICD-10-CM

## 2017-11-24 LAB — METABOLIC PANEL, COMPREHENSIVE
ALT (SGPT): 23 U/L (ref 12–78)
AST (SGOT): 13 U/L — ABNORMAL LOW (ref 15–37)
Albumin: 4 gm/dl (ref 3.4–5.0)
Alk. phosphatase: 79 U/L (ref 45–117)
Anion gap: 7 mmol/L (ref 5–15)
BUN: 22 mg/dl (ref 7–25)
Bilirubin, total: 0.9 mg/dl (ref 0.2–1.0)
CO2: 28 mEq/L (ref 21–32)
Calcium: 9.4 mg/dl (ref 8.5–10.1)
Chloride: 105 mEq/L (ref 98–107)
Creatinine: 0.8 mg/dl (ref 0.6–1.3)
GFR est AA: >60
GFR est non-AA: >60
Glucose: 94 mg/dl (ref 74–106)
Potassium: 3.9 mEq/L (ref 3.5–5.1)
Protein, total: 7.6 gm/dl (ref 6.4–8.2)
Sodium: 140 mEq/L (ref 136–145)

## 2017-11-24 LAB — CBC WITH AUTOMATED DIFF
BASOPHILS: 0.4 % (ref 0–3)
EOSINOPHILS: 2.3 % (ref 0–5)
HCT: 40.2 % (ref 37.0–50.0)
HGB: 12.2 gm/dl — ABNORMAL LOW (ref 13.0–17.2)
IMMATURE GRANULOCYTES: 0.2 % (ref 0.0–3.0)
LYMPHOCYTES: 35.9 % (ref 28–48)
MCH: 24.6 pg — ABNORMAL LOW (ref 25.4–34.6)
MCHC: 30.3 gm/dl (ref 30.0–36.0)
MCV: 81 fL (ref 80.0–98.0)
MONOCYTES: 9.1 % (ref 1–13)
MPV: 10.2 fL — ABNORMAL HIGH (ref 6.0–10.0)
NEUTROPHILS: 52.1 % (ref 34–64)
NRBC: 0 (ref 0–0)
PLATELET: 373 10*3/uL (ref 140–450)
RBC: 4.96 M/uL (ref 3.60–5.20)
RDW-SD: 40.4 (ref 36.4–46.3)
WBC: 5.6 10*3/uL (ref 4.0–11.0)

## 2017-11-24 LAB — C REACTIVE PROTEIN, QT: C-Reactive protein: <2.9 mg/L (ref 0.0–2.9)

## 2017-11-24 LAB — RHEUMATOID FACTOR, QL: Rheumatoid factor: <10 IU/ml (ref 0–14)

## 2017-11-24 LAB — SED RATE (ESR): Sed rate (ESR): 0 mm/Hr (ref 0–30)

## 2017-11-27 LAB — CK ISOENZYMES
CK-BB: NOT DETECTED %
CK-MB: 0 % (ref <5)
CK-MM: 100 % (ref 95–100)
Creatine Kinase,Total: 106 U/L (ref 29–143)
Interpretation: NORMAL

## 2017-11-29 ENCOUNTER — Inpatient Hospital Stay: Admit: 2017-11-29 | Payer: BLUE CROSS/BLUE SHIELD | Primary: Internal Medicine

## 2017-11-29 DIAGNOSIS — E669 Obesity, unspecified: Secondary | ICD-10-CM

## 2017-11-29 LAB — ANTINUCLEAR ANTIBODIES, IFA: Antinuclear Abs, IFA: NEGATIVE

## 2017-11-29 NOTE — Progress Notes (Signed)
Nutrition Counseling for Obesity   Why Weight Program    Ht:    65.0 in  Wt:   184.4 lb today           185.2 lb (10/10/17)           185.4 lb (09/12/17)              Body mass index is 30.69 kg/m??.    Katherine Keller is a 57 y.o. female seen today for nutrition counseling for weight loss and healthy eating.      Past Medical History includes:  Past Medical History:   Diagnosis Date   ??? Arthritis          Current Medications:  Current Outpatient Medications   Medication Sig Dispense Refill   ??? naproxen (NAPROSYN) 250 mg tablet Take  by mouth as needed.     ??? black cohosh 540 mg cap Take  by mouth.     ??? cholecalciferol (VITAMIN D3) 1,000 unit tablet Take  by mouth daily.     ??? traMADol (ULTRAM) 50 mg tablet Take 50 mg by mouth two (2) times a day. Indications: PAIN        Katherine Keller returned today for a follow-up nutrition appointment. Her weight is stable over the last month. She has lost 1 lb since the start of her [facility] program.   She kept most food records for the last 4 weeks.Katherine Keller works approximately 60 hours per week. She often eats foods that are easily available at work due to being in a hurry. She especially has difficulty with sweets while at work. Coworkers bring in candy, she is around cookies and desserts daily. She continues to avoid sugar sweetened beverages.  She has started back on fast food due to being too tired to cook at night after work. .   Katherine Keller continues to skip meals and frequently eats large dinner meals due to her work schedule.   She admits to tremendous on the job stress and realizes skipping meals is not good for her health or weight loss.   ??  We discussed meals and snacks that she can incorporate in to he current life schedule to prevent missed meals. Wed discussed batch cooking on her days off to avoid eating poorly at night after dinner. We reviewed her food logs and made recommendations for improvements. Reviewed the Flexible  Meal Plan for Weight Loss and provided written material.      She does not perform regular exercise, however her job is extremely active, she averages 16,000-18,000 steps per day.   ??  Katherine Keller continues to be motivated to make lifestyle changes to improve her overall health and help her to lose weight.  We reviewed her previously set goals and these are attached for reference. She has recommitted to eliminate sugary desserts and candy  ??  Katherine Keller was a pleasure to work with today. She would like to return for follow-up to assist with weight loss. ??  Katherine Barbara, MS, RDN, CSOWM, CDE  ??  Katherine Keller's Weight Loss Goals:  ??  Eat 3 meals per day, focusing on whole grains, fruits, vegetables, lean meats, healthy fats and low sodium foods.  Eliminate sugar sweetened beverages-check Vitamin Water  Limit intake of sugar??and fat??from snack foods-limit sweets and chips  Avoid fatty,??fried, fast foods and processed foods  Reduce entree servings and add veggies at lunch and dinner  Increase intake fiber from whole grains  Increase  intake of non-starchy vegetables  Increase intake of fresh fruit   Keep food logs for next 30 days to be discussed with dietitian on next visit  Add strength training 2-3 days per week??  ??

## 2017-11-29 NOTE — Progress Notes (Signed)
 Nutrition Counseling for Obesity   Why Weight Program    Ht:    65.0 in  Wt:   184.4 lb today           185.2 lb (10/10/17)           185.4 lb (09/12/17)              Body mass index is 30.69 kg/m.    Katherine Keller is a 57 y.o. female seen today for nutrition counseling for weight loss and healthy eating.      Past Medical History includes:  Past Medical History:   Diagnosis Date   . Arthritis          Current Medications:  Current Outpatient Medications   Medication Sig Dispense Refill   . naproxen (NAPROSYN) 250 mg tablet Take  by mouth as needed.     . black cohosh 540 mg cap Take  by mouth.     . cholecalciferol (VITAMIN D3) 1,000 unit tablet Take  by mouth daily.     . traMADol (ULTRAM) 50 mg tablet Take 50 mg by mouth two (2) times a day. Indications: PAIN        Katherine Keller returned today for a follow-up nutrition appointment. Her weight is stable over the last month. She has lost 1 lb since the start of her [facility] program.   She kept most food records for the last 4 weeks.Katherine Keller works approximately 60 hours per week. She often eats foods that are easily available at work due to being in a hurry. She especially has difficulty with sweets while at work. Coworkers bring in candy, she is around cookies and desserts daily. She continues to avoid sugar sweetened beverages.  She has started back on fast food due to being too tired to cook at night after work. .   Katherine Keller continues to skip meals and frequently eats large dinner meals due to her work schedule.   She admits to tremendous on the job stress and realizes skipping meals is not good for her health or weight loss.     We discussed meals and snacks that she can incorporate in to he current life schedule to prevent missed meals. Wed discussed batch cooking on her days off to avoid eating poorly at night after dinner. We reviewed her food logs and made recommendations for improvements. Reviewed the Flexible Meal Plan for Weight  Loss and provided written material.      She does not perform regular exercise, however her job is extremely active, she averages 16,000-18,000 steps per day.     Katherine Keller continues to be motivated to make lifestyle changes to improve her overall health and help her to lose weight.  We reviewed her previously set goals and these are attached for reference. She has recommitted to eliminate sugary desserts and candy    Katherine Keller was a pleasure to work with today. She would like to return for follow-up to assist with weight loss.   Katherine Tomie Gals, MS, RDN, CSOWM, CDE    Katherine Keller's Weight Loss Goals:    Eat 3 meals per day, focusing on whole grains, fruits, vegetables, lean meats, healthy fats and low sodium foods.  Eliminate sugar sweetened beverages-check Vitamin Water  Limit intake of sugarand fatfrom snack foods-limit sweets and chips  Avoid fatty,fried, fast foods and processed foods  Reduce entree servings and add veggies at lunch and dinner  Increase intake fiber from whole grains  Increase  intake of non-starchy vegetables  Increase intake of fresh fruit   Keep food logs for next 30 days to be discussed with dietitian on next visit  Add strength training 2-3 days per week

## 2017-11-30 LAB — CYCLIC CITRUL PEPTIDE AB, IGG: CCP Ab, IgG: <16 Units (ref <20)

## 2017-12-07 ENCOUNTER — Inpatient Hospital Stay: Admit: 2017-12-07 | Payer: BLUE CROSS/BLUE SHIELD | Primary: Internal Medicine

## 2017-12-07 ENCOUNTER — Encounter

## 2017-12-07 DIAGNOSIS — M7732 Calcaneal spur, left foot: Secondary | ICD-10-CM

## 2017-12-14 ENCOUNTER — Inpatient Hospital Stay: Admit: 2017-12-14 | Primary: Internal Medicine

## 2017-12-14 LAB — LIPID PANEL
CHOL/HDL Ratio: 2.5 Ratio (ref 0.0–4.4)
Chol/HDL Ratio: 2.5 Ratio (ref 0.0–4.4)
Cholesterol, Total: 194 mg/dl (ref 140–199)
Cholesterol, total: 194 mg/dl (ref 140–199)
HDL Cholesterol: 78 mg/dl (ref 40–96)
HDL: 78 mg/dl (ref 40–96)
LDL Calculated: 108 mg/dl (ref 0–130)
LDL, calculated: 108 mg/dl (ref 0–130)
Triglyceride: 40 mg/dl (ref 29–150)
Triglycerides: 40 mg/dl (ref 29–150)

## 2017-12-14 LAB — HEMOGLOBIN A1C W/O EAG
Hemoglobin A1C: 5.6 % (ref 4.2–6.3)
Hemoglobin A1c: 5.6 % (ref 4.2–6.3)

## 2017-12-15 ENCOUNTER — Encounter

## 2018-01-23 ENCOUNTER — Inpatient Hospital Stay: Payer: BLUE CROSS/BLUE SHIELD | Attending: Internal Medicine | Primary: Internal Medicine

## 2018-01-31 ENCOUNTER — Inpatient Hospital Stay: Admit: 2018-01-31 | Payer: BLUE CROSS/BLUE SHIELD | Attending: Internal Medicine | Primary: Internal Medicine

## 2018-01-31 DIAGNOSIS — R928 Other abnormal and inconclusive findings on diagnostic imaging of breast: Secondary | ICD-10-CM

## 2019-01-08 ENCOUNTER — Inpatient Hospital Stay: Admit: 2019-01-08 | Primary: Internal Medicine

## 2019-01-08 LAB — LIPID PANEL
CHOL/HDL Ratio: 2.8 Ratio (ref 0.0–4.4)
Chol/HDL Ratio: 2.8 Ratio (ref 0.0–4.4)
Cholesterol, Total: 182 mg/dl (ref 140–199)
Cholesterol, total: 182 mg/dl (ref 140–199)
HDL Cholesterol: 66 mg/dl (ref 40–96)
HDL: 66 mg/dl (ref 40–96)
LDL Calculated: 107 mg/dl (ref 0–130)
LDL, calculated: 107 mg/dl (ref 0–130)
Triglyceride: 46 mg/dl (ref 29–150)
Triglycerides: 46 mg/dl (ref 29–150)

## 2019-01-08 LAB — HEMOGLOBIN A1C W/O EAG
Hemoglobin A1C: 5.6 % (ref 4.2–5.6)
Hemoglobin A1c: 5.6 % (ref 4.2–5.6)

## 2019-04-10 ENCOUNTER — Inpatient Hospital Stay: Admit: 2019-04-10 | Discharge: 2019-04-10 | Payer: BLUE CROSS/BLUE SHIELD | Primary: Internal Medicine

## 2019-04-10 ENCOUNTER — Encounter

## 2019-04-10 DIAGNOSIS — M545 Low back pain: Secondary | ICD-10-CM

## 2019-04-10 LAB — CBC WITH AUTOMATED DIFF
BASOPHILS: 0.4 % (ref 0–3)
EOSINOPHILS: 3.9 % (ref 0–5)
HCT: 42.4 % (ref 37.0–50.0)
HGB: 12.7 gm/dl — ABNORMAL LOW (ref 13.0–17.2)
IMMATURE GRANULOCYTES: 0.2 % (ref 0.0–3.0)
LYMPHOCYTES: 34.6 % (ref 28–48)
MCH: 25.6 pg (ref 25.4–34.6)
MCHC: 30 gm/dl (ref 30.0–36.0)
MCV: 85.3 fL (ref 80.0–98.0)
MONOCYTES: 6.9 % (ref 1–13)
MPV: 11 fL — ABNORMAL HIGH (ref 6.0–10.0)
NEUTROPHILS: 54 % (ref 34–64)
NRBC: 0 (ref 0–0)
PLATELET: 342 10*3/uL (ref 140–450)
RBC: 4.97 M/uL (ref 3.60–5.20)
RDW-SD: 41.1 (ref 36.4–46.3)
WBC: 5.7 10*3/uL (ref 4.0–11.0)

## 2019-04-10 LAB — URINALYSIS W/ RFLX MICROSCOPIC
Bilirubin, Urine: NEGATIVE
Bilirubin: NEGATIVE
Blood, Urine: NEGATIVE
Blood: NEGATIVE
Glucose, Ur: NEGATIVE mg/dl
Glucose: NEGATIVE mg/dl
Ketone: NEGATIVE mg/dl
Ketones, Urine: NEGATIVE mg/dl
Leukocyte Esterase, Urine: NEGATIVE
Leukocyte Esterase: NEGATIVE
Nitrite, Urine: NEGATIVE
Nitrites: NEGATIVE
Protein, UA: NEGATIVE mg/dl
Protein: NEGATIVE mg/dl
Specific Gravity, UA: 1.025 (ref 1.005–1.030)
Specific gravity: 1.025 (ref 1.005–1.030)
Urobilinogen, UA, POCT: 0.2 mg/dl (ref 0.0–1.0)
Urobilinogen: 0.2 mg/dl (ref 0.0–1.0)
pH (UA): 5.5 (ref 5.0–9.0)
pH, UA: 5.5 (ref 5.0–9.0)

## 2019-04-10 LAB — CBC WITH AUTO DIFFERENTIAL
Basophils %: 0.4 % (ref 0–3)
Eosinophils %: 3.9 % (ref 0–5)
Hematocrit: 42.4 % (ref 37.0–50.0)
Hemoglobin: 12.7 gm/dl — ABNORMAL LOW (ref 13.0–17.2)
Immature Granulocytes: 0.2 % (ref 0.0–3.0)
Lymphocytes %: 34.6 % (ref 28–48)
MCH: 25.6 pg (ref 25.4–34.6)
MCHC: 30 gm/dl (ref 30.0–36.0)
MCV: 85.3 fL (ref 80.0–98.0)
MPV: 11 fL — ABNORMAL HIGH (ref 6.0–10.0)
Monocytes %: 6.9 % (ref 1–13)
Neutrophils %: 54 % (ref 34–64)
Nucleated RBCs: 0 (ref 0–0)
Platelets: 342 10*3/uL (ref 140–450)
RBC: 4.97 M/uL (ref 3.60–5.20)
RDW-SD: 41.1 (ref 36.4–46.3)
WBC: 5.7 10*3/uL (ref 4.0–11.0)

## 2019-04-11 LAB — LIPID PANEL
CHOL/HDL Ratio: 3.1 Ratio (ref 0.0–4.4)
Chol/HDL Ratio: 3.1 Ratio (ref 0.0–4.4)
Cholesterol, Total: 174 mg/dl (ref 140–199)
Cholesterol, total: 174 mg/dl (ref 140–199)
HDL Cholesterol: 56 mg/dl (ref 40–96)
HDL: 56 mg/dl (ref 40–96)
LDL Calculated: 92 mg/dl (ref 0–130)
LDL, calculated: 92 mg/dl (ref 0–130)
Triglyceride: 132 mg/dl (ref 29–150)
Triglycerides: 132 mg/dl (ref 29–150)

## 2019-04-11 LAB — METABOLIC PANEL, COMPREHENSIVE
ALT (SGPT): 21 U/L (ref 12–78)
AST (SGOT): 15 U/L (ref 15–37)
Albumin: 3.6 gm/dl (ref 3.4–5.0)
Alk. phosphatase: 79 U/L (ref 45–117)
Anion gap: 5 mmol/L (ref 5–15)
BUN: 16 mg/dl (ref 7–25)
Bilirubin, total: 0.6 mg/dl (ref 0.2–1.0)
CO2: 29 mEq/L (ref 21–32)
Calcium: 9 mg/dl (ref 8.5–10.1)
Chloride: 105 mEq/L (ref 98–107)
Creatinine: 0.7 mg/dl (ref 0.6–1.3)
GFR est AA: >60
GFR est non-AA: >60
Glucose: 77 mg/dl (ref 74–106)
Potassium: 4.3 mEq/L (ref 3.5–5.1)
Protein, total: 7.2 gm/dl (ref 6.4–8.2)
Sodium: 139 mEq/L (ref 136–145)

## 2019-04-11 LAB — TSH 3RD GENERATION
TSH: 1.99 u[IU]/mL (ref 0.358–3.740)
TSH: 1.99 u[IU]/mL (ref 0.358–3.740)

## 2019-04-11 LAB — VITAMIN D, 25 HYDROXY: Vitamin D, 25-OH, Total: 27 ng/ml — ABNORMAL LOW (ref 30.0–100.0)

## 2019-04-11 LAB — HEMOGLOBIN A1C W/O EAG
Hemoglobin A1C: 5.6 % (ref 4.2–5.6)
Hemoglobin A1c: 5.6 % (ref 4.2–5.6)

## 2019-04-11 LAB — T4, FREE
FREE T4, FT4T: 1.17 ng/dl (ref 0.76–1.46)
Free T4: 1.17 ng/dl (ref 0.76–1.46)

## 2019-04-11 LAB — COMPREHENSIVE METABOLIC PANEL
ALT: 21 U/L (ref 12–78)
AST: 15 U/L (ref 15–37)
Albumin: 3.6 gm/dl (ref 3.4–5.0)
Alkaline Phosphatase: 79 U/L (ref 45–117)
Anion Gap: 5 mmol/L (ref 5–15)
BUN: 16 mg/dl (ref 7–25)
CO2: 29 mEq/L (ref 21–32)
Calcium: 9 mg/dl (ref 8.5–10.1)
Chloride: 105 mEq/L (ref 98–107)
Creatinine: 0.7 mg/dl (ref 0.6–1.3)
EGFR IF NonAfrican American: >60
GFR African American: >60
Glucose: 77 mg/dl (ref 74–106)
Potassium: 4.3 mEq/L (ref 3.5–5.1)
Sodium: 139 mEq/L (ref 136–145)
Total Bilirubin: 0.6 mg/dl (ref 0.2–1.0)
Total Protein: 7.2 gm/dl (ref 6.4–8.2)

## 2019-04-11 LAB — VITAMIN D 25 HYDROXY: Vitamin D, 25-OH, Total: 27 ng/ml — ABNORMAL LOW (ref 30.0–100.0)

## 2019-05-02 ENCOUNTER — Inpatient Hospital Stay: Admit: 2019-05-02 | Payer: BLUE CROSS/BLUE SHIELD | Attending: Internal Medicine | Primary: Internal Medicine

## 2019-05-02 DIAGNOSIS — Z1231 Encounter for screening mammogram for malignant neoplasm of breast: Secondary | ICD-10-CM

## 2019-08-16 ENCOUNTER — Encounter

## 2019-08-16 ENCOUNTER — Inpatient Hospital Stay: Admit: 2019-08-16 | Payer: BLUE CROSS/BLUE SHIELD | Primary: Internal Medicine

## 2019-08-16 DIAGNOSIS — M5030 Other cervical disc degeneration, unspecified cervical region: Secondary | ICD-10-CM

## 2019-08-28 ENCOUNTER — Encounter

## 2019-08-29 ENCOUNTER — Inpatient Hospital Stay: Admit: 2019-08-29 | Payer: BLUE CROSS/BLUE SHIELD | Primary: Internal Medicine

## 2019-08-29 DIAGNOSIS — M4802 Spinal stenosis, cervical region: Secondary | ICD-10-CM

## 2019-08-29 DIAGNOSIS — M542 Cervicalgia: Secondary | ICD-10-CM

## 2019-09-12 ENCOUNTER — Inpatient Hospital Stay: Admit: 2019-09-12 | Discharge: 2019-09-12 | Payer: BLUE CROSS/BLUE SHIELD | Primary: Internal Medicine

## 2019-09-12 ENCOUNTER — Encounter

## 2019-09-12 DIAGNOSIS — M654 Radial styloid tenosynovitis [de Quervain]: Secondary | ICD-10-CM

## 2019-09-12 LAB — ELECTROLYTES
Anion Gap: 7 mmol/L (ref 5–15)
Anion gap: 7 mmol/L (ref 5–15)
CO2: 29 mEq/L (ref 21–32)
CO2: 29 mEq/L (ref 21–32)
Chloride: 104 mEq/L (ref 98–107)
Chloride: 104 mEq/L (ref 98–107)
Potassium: 3.9 mEq/L (ref 3.5–5.1)
Potassium: 3.9 mEq/L (ref 3.5–5.1)
Sodium: 139 mEq/L (ref 136–145)
Sodium: 139 mEq/L (ref 136–145)

## 2019-09-12 LAB — EKG, 12 LEAD, INITIAL
Atrial Rate: 67 {beats}/min
Calculated P Axis: 50 degrees
Calculated R Axis: 55 degrees
Calculated T Axis: 39 degrees
P-R Interval: 234 ms
Q-T Interval: 376 ms
QRS Duration: 88 ms
QTC Calculation (Bezet): 397 ms
Ventricular Rate: 67 {beats}/min

## 2019-09-12 LAB — CBC WITH AUTOMATED DIFF
BASOPHILS: 0.5 % (ref 0–3)
EOSINOPHILS: 3.3 % (ref 0–5)
HCT: 39.6 % (ref 37.0–50.0)
HGB: 12.6 gm/dl — ABNORMAL LOW (ref 13.0–17.2)
IMMATURE GRANULOCYTES: 0.3 % (ref 0.0–3.0)
LYMPHOCYTES: 39.7 % (ref 28–48)
MCH: 26.5 pg (ref 25.4–34.6)
MCHC: 31.8 gm/dl (ref 30.0–36.0)
MCV: 83.2 fL (ref 80.0–98.0)
MONOCYTES: 7.8 % (ref 1–13)
MPV: 10.5 fL — ABNORMAL HIGH (ref 6.0–10.0)
NEUTROPHILS: 48.4 % (ref 34–64)
NRBC: 0 (ref 0–0)
PLATELET: 316 10*3/uL (ref 140–450)
RBC: 4.76 M/uL (ref 3.60–5.20)
RDW-SD: 41.1 (ref 36.4–46.3)
WBC: 6.7 10*3/uL (ref 4.0–11.0)

## 2019-09-12 LAB — CBC WITH AUTO DIFFERENTIAL
Basophils %: 0.5 % (ref 0–3)
Eosinophils %: 3.3 % (ref 0–5)
Hematocrit: 39.6 % (ref 37.0–50.0)
Hemoglobin: 12.6 gm/dl — ABNORMAL LOW (ref 13.0–17.2)
Immature Granulocytes: 0.3 % (ref 0.0–3.0)
Lymphocytes %: 39.7 % (ref 28–48)
MCH: 26.5 pg (ref 25.4–34.6)
MCHC: 31.8 gm/dl (ref 30.0–36.0)
MCV: 83.2 fL (ref 80.0–98.0)
MPV: 10.5 fL — ABNORMAL HIGH (ref 6.0–10.0)
Monocytes %: 7.8 % (ref 1–13)
Neutrophils %: 48.4 % (ref 34–64)
Nucleated RBCs: 0 (ref 0–0)
Platelets: 316 10*3/uL (ref 140–450)
RBC: 4.76 M/uL (ref 3.60–5.20)
RDW-SD: 41.1 (ref 36.4–46.3)
WBC: 6.7 10*3/uL (ref 4.0–11.0)

## 2019-09-12 LAB — EKG 12-LEAD
Atrial Rate: 67 {beats}/min
P Axis: 50 degrees
P-R Interval: 234 ms
Q-T Interval: 376 ms
QRS Duration: 88 ms
QTc Calculation (Bazett): 397 ms
R Axis: 55 degrees
T Axis: 39 degrees
Ventricular Rate: 67 {beats}/min

## 2020-01-22 ENCOUNTER — Inpatient Hospital Stay: Admit: 2020-01-22 | Payer: BLUE CROSS/BLUE SHIELD | Primary: Internal Medicine

## 2020-01-22 DIAGNOSIS — S43421D Sprain of right rotator cuff capsule, subsequent encounter: Secondary | ICD-10-CM

## 2020-01-22 NOTE — Progress Notes (Signed)
 Progress Notes by Billy Jenkins Jansky, PT at 01/22/20 1000                Author: Billy Jenkins Jansky, PT  Service: --  Author Type: Physical Therapist       Filed: 01/22/20 1555  Date of Service: 01/22/20 1000  Status: Signed          Editor: Billy Jenkins Jansky, PT (Physical Therapist)                       [address] [place], [place] [address]   [phone]   __________________________________________________________   OUTPATIENT PHYSICAL THERAPY   INITIAL EVALUATION      Patient: Katherine Keller       DOB: [birth year]         Age: 59 y.o.            MRN : [id number]   Date: 01/22/2020        Referring Physician: Gregor Ellene Maffucci, PA         Provider of Established POC: Jenkins Jansky Billy, MPT           Provider Signature:  Electronically signed by Jenkins Jansky Billy, MPT       Diagnosis: Pain in right shoulder [M25.511] , RTC strain   Treatment Diagnosis: R RTC strain, R shoulder pain, decreased R shoulder ROM, decreased R shoulder/RTC strength, decreased functional use  of R UE, decreased posture, difficulty sleeping.    Onset: 2016    Frequency/Duration: 2 times per week for 12 visits.   Prescription: Eval & Treat          I certify the need for these services furnished under this plan of treatment and while under my care.            Signature: _________________________________ Date: _________________                Gregor Ellene Maffucci, PA         PLEASE SIGN AND RETURN VIA FAX TO [phone]            SUBJECTIVE:     History of present illness: Pt reports she injured her R shoulder in 2016 when she picked up case of cantelope.  Pain increase  w/ lifting w/ R arm, reaching overhead/away from body/behind back, doing cashier duties (ringing items & bagging), sleeping, pushing up w/ R UE to get out of bed.  Pain decreases CP, salon pas patch.  Pain range 5/10-9/10.  It's always painful, that  is why I'm painful.  Pain is waking pt from sleeping throughout night.  Since 2016 she has had cortisone  injections every 6 months & usually good 6 month relief, but this last injection on 12/26/19 relief only lasted a month.     Location: R shoulder (anterior/lateral/posterior, supraspinatus, infraspinatus, & subdeltoid bursa area), radiates to mid upper arm, & can intermittently shoot up R shoulder to neck. No numbness & tingling in UE.   Date of Onset:  2016   Pain Scale Rating:  5/10-9/10   Medications: Reviewed in chart.   Allergies: Reviewed in chart   Precautions:  HTN, DM   Past Medical History:      Past Medical History:        Diagnosis  Date         ?  Arthritis          Previous Physical Therapy:  Pt had PT/OT on wrist  in past.   Patient's Goal:  Decrease pain, be able to use R arm better, sleep w/out awakening from painIMPRESSION:      R shoulder MRI Results:  Performed on 08/29/19   1. Severe anterior insertional and posterior critical zone supraspinatus   tendinosis. Tiny superimposed interstitial tearing of the distal supraspinatus   at the footplate. No high-grade tearing or retraction.   2. Mild to moderate insertional infraspinatus and subscapularis tendinosis. No   high-grade tearing.   3. Moderate fluid within the subacromial/subdeltoid bursa, most consistent with   bursitis.   4. Low-grade longitudinal split tearing of the long head biceps tendon with   prominent long head biceps tendinosis. No high-grade tearing or distal   retraction.   5. 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        Strength: L shoulder strength is 5/5.  R shoulder/RTC strength is 3/5 due to pain w/ resistive testing.       Palpation: Pt has significant 4/4 tenderness along R anterior/lateral/posterior shoulder, R supraspinatus.       Posture: Pt has rounded shoulders, protracted scapulae, & slight forward head.      Sensation: Intact to  light touch in B UE.      Special Tests: + Drop Arm Test R, + Empty Can Test R, Kennedy-Hawkins Impingement Test R        TODAY'S TREATMENT:     Evaluation followed by: Review of pt's POC & PT goals.  Pt received pulsed US  50% at 1.8 w/cm2 x 10 min to R shoulder/RTC (seated), then CP x 15 min to R shoulder/scap/upper arm,  followed by iontophoresis patch w/ 1.3 mL dexamethasone to R supraspinatus.  Pt was instructed on positioning of R shoulder/UE to reduce pain while sleeping.        Patient Education:  Discussed reasoning for each modality for anti-inflammatory effect.   Discussed proper posture, shoulder/scapula positioning, & sleeping position/support.      Treatment Rationale: Decrease Pain   Decrease Inflammation   Increase Soft Tissue Extensibility   Improve Mobility   Improve posture/positioning        ASSESSMENT:     Pt presents w/ diagnosis of R RTC strain with the following problems:   R RTC strain, R shoulder pain, decreased R shoulder ROM, decreased R shoulder/RTC strength, decreased functional use of R UE, decreased posture, difficulty sleeping.         GOALS:     These goals were created with the patient & the patient verbalized understanding & agreement.      GOALS:  To be achieved in 12 visits.      1.  Decrease R shoulder pain to 0-2/10 w/ functional ADL, work duties, & sleeping.   2.  Restore R shoulder AROM to [facility].   3.  Restore R shoulder/RTC strength to [facility].   4.  Improve pt's posture to more retracted shoulder/scapulae & neck/head more toward neutral position.   5.  Pt will be able to sleep without waking from R shoulder pain.   6.  Pt will be independent w/ HEP.      Rehab Potential: Good to meet the above stated goals.   Barriers to achieving goals: None  PLAN:     Based on the above objective findings, the patient will benefit from skilled Physical Therapy intervention to address the found impairments.       Physical Therapy 2 times/week for 6 weeks for a total of 12 visits to include  the following treatment:  R shoulder/scapula/RTC mobility, stretching, scapular stabilization/RTC strengthening, manual, modalities, & HEP.      Thank you for this referral.               Jenkins Earnie Senters, MPT

## 2020-01-24 ENCOUNTER — Encounter: Payer: BLUE CROSS/BLUE SHIELD | Primary: Internal Medicine

## 2020-01-29 ENCOUNTER — Encounter: Payer: BLUE CROSS/BLUE SHIELD | Primary: Internal Medicine

## 2020-01-31 ENCOUNTER — Encounter: Payer: BLUE CROSS/BLUE SHIELD | Primary: Internal Medicine

## 2020-02-04 ENCOUNTER — Inpatient Hospital Stay: Admit: 2020-02-04 | Payer: BLUE CROSS/BLUE SHIELD | Primary: Internal Medicine

## 2020-02-04 NOTE — Progress Notes (Signed)
 Physical Therapy Daily Treatment       Patient: Katherine Keller  Patient DOB verified :Yes  DOB:  [birth year]  Visit #: 2 of 12 (Insurance exp: 07/11/20)  Total Treatment Time: 40 minutes  Total Session Time:  45 minutes  Diagnosis: Pain in right shoulder [M25.511], RTC strain  Treatment Diagnosis: R RTC strain, R shoulder pain, decreased R shoulder ROM, decreased R shoulder/RTC strength, decreased functional use of R UE, decreased posture, difficulty sleeping.  Onset: 2016  Treatment Area: R shoulder/scap/neck/RTC, postural    Subjective:     Subjective functional status/changes: Pt reports her last PT treatment did help reduce her pain, but she hurts w/ all the activities (lifting, pushing, pulling, stirring) she must do at work.    Pain Level (0-10 scale) current: 8/10    Objective:     Treatment Program:  Pulsed US  50% at 1.8 w/cm2 x 12 min to R shoulder/RTC (seated).  E-Stim (IFC)/CP x 20 min to R shoulder/scap/upper arm/neck  Iontophoresis patch w/ 1.3 mL dexamethasone to R supraspinatus.  Discussed using CP at work & home after activity/work to help decrease inflammation.    Patient Education:  Reviewed rationale for each modality/treatment.  Discussed using CP after work duties & even at lunchtime to help minimize irritation.    Rationale for the above treatment:Decrease Pain  Decrease Inflammation  Increase Soft Tissue Extensibility  Improve Mobility  Improve Posture     Assessment:      Pt has just gotten off work this morning (from night shift) & R shoulder pain is 8/10.  She has difficulty actively raising her arm.  We discussed trying to reduce inflammation & pain & progressing into stretching & exercises. Pt is in agreement.  Told pt she can increase the # of PT visits/week to reduce this pain & get arm working better.    Plan:     Continue Therapy per Plan of Care.  Assess response to modalities.  Add exercises & stretching as tolerated.

## 2020-02-13 ENCOUNTER — Inpatient Hospital Stay: Admit: 2020-02-13 | Primary: Internal Medicine

## 2020-02-13 LAB — HEMOGLOBIN A1C W/O EAG
Hemoglobin A1C: 5.7 % — ABNORMAL HIGH (ref 4.2–5.6)
Hemoglobin A1c: 5.7 % — ABNORMAL HIGH (ref 4.2–5.6)

## 2020-02-13 LAB — LIPID PANEL
CHOL/HDL Ratio: 2.7 Ratio (ref 0.0–4.4)
Chol/HDL Ratio: 2.7 Ratio (ref 0.0–4.4)
Cholesterol, Total: 205 mg/dl — ABNORMAL HIGH (ref 140–199)
Cholesterol, total: 205 mg/dl — ABNORMAL HIGH (ref 140–199)
HDL Cholesterol: 75 mg/dl (ref 40–96)
HDL: 75 mg/dl (ref 40–96)
LDL Calculated: 122 mg/dl (ref 0–130)
LDL, calculated: 122 mg/dl (ref 0–130)
Triglyceride: 42 mg/dl (ref 29–150)
Triglycerides: 42 mg/dl (ref 29–150)

## 2020-02-14 ENCOUNTER — Inpatient Hospital Stay: Admit: 2020-02-14 | Payer: BLUE CROSS/BLUE SHIELD | Primary: Internal Medicine

## 2020-02-14 DIAGNOSIS — M7541 Impingement syndrome of right shoulder: Secondary | ICD-10-CM

## 2020-02-14 NOTE — Progress Notes (Signed)
 Physical Therapy Daily Treatment       Patient: Katherine Keller  Patient DOB verified :Yes  DOB:  1960-07-30  Visit #: 3 of 12 (Insurance exp: 07/11/20)  Total Treatment Time: 35 minutes  Total Session Time:  50 minutes  Diagnosis: Pain in right shoulder [M25.511], RTC strain  Treatment Diagnosis: R RTC strain, R shoulder pain, decreased R shoulder ROM, decreased R shoulder/RTC strength, decreased functional use of R UE, decreased posture, difficulty sleeping.  Onset: 2016  Treatment Area: R shoulder/scap/neck/RTC, postural    Subjective:     Subjective functional status/changes: Pt reports she feels good today, no R shoulder pain & she worked the night shift & is coming into PT straight from work.  Yesterday was horrible though, but I did ice last night & I feel great today.  Pain Level (0-10 scale) current: 0/10    Objective:     Treatment Program:    Therapeutic exercises (see chart below) for R shoulder mobility, stretching, & strengthening/stabilization.  R shoulder PROM & stretching as tolerated.    R shoulder AROM:  (measured in supine):  Flex 155, ABD 132, Scap 175, ER 90, IR 76. (35 min)  CP x 15 min to R shoulder/scap/upper arm/neck.  Iontophoresis patch w/ 1.3 mL dexamethasone to R supraspinatus.ND  Discussed using CP at work & home after [employer] everyday to help decrease inflammation, manage pain.   02/14/2020     Exercise Wt/Reps     R shoulder PROM/stretching DONE   Wand shoulder flex   1#, 2x10   Wand shoulder ER/IR   1#, 10     Supine scap punches 15   Shoulder/scap stabs: circles CW/CCW 10 each way   Standing wall slides: flex & abd 10 each               Patient Education:  Reviewed rationale for each modality/treatment.  Discussed using CP after work duties & even at lunchtime to help minimize irritation.    Rationale for the above treatment:Decrease Pain  Decrease Inflammation  Increase Soft Tissue Extensibility  Improve Mobility  Improve Posture     Assessment:      Pt has significant  improvement in pain control & R shoulder ROM & functional use today.  Pain is graded 0/10 & she has come straight from her overnight work shift.    Yesterday pain was horrible & she is unsure what she did to make it horrible; however, she did ice last night & feels much improvement.  She tolerated PROM/stretching, but did struggle w/ some of the exercises.      Plan:     Continue Therapy per Plan of Care.  Assess response to exercises & stretching/manual work.  Add exercises & stretching as tolerated.

## 2020-02-18 ENCOUNTER — Inpatient Hospital Stay: Admit: 2020-02-18 | Payer: BLUE CROSS/BLUE SHIELD | Primary: Internal Medicine

## 2020-02-18 NOTE — Progress Notes (Signed)
Physical Therapy Daily Treatment       Patient: Katherine Keller  Patient DOB verified :Yes  DOB:  1960-11-15  Visit #: 4 of 12 (Insurance exp: 07/11/20)  Total Treatment Time: 40 minutes  Total Session Time:  55 minutes  Diagnosis: Pain in right shoulder [M25.511], RTC strain  Treatment Diagnosis: R RTC strain, R shoulder pain, decreased R shoulder ROM, decreased R shoulder/RTC strength, decreased functional use of R UE, decreased posture, difficulty sleeping.  Onset: 2016  Treatment Area: R shoulder/scap/neck/RTC, postural    Subjective:     Subjective functional status/changes: Pt reports the ice at night before sleep allows her to have a peaceful sleep.  She c/o a deep tenderness at subdeltoid bursa.     Pain Level (0-10 scale) current: 0-9/10     Objective:     Treatment Program:    Therapeutic exercises (see chart below) for R shoulder mobility, stretching, & strengthening/stabilization.  R shoulder PROM, stretching, gentle joint mobilization as tolerated.  Added TB shoulder/scap stabilization exercises. (35 min)  CP x 15 min to R shoulder/scap/upper arm.  Iontophoresis patch w/ 1.3 mL dexamethasone to R subdeltoid bursa.     02/18/2020     Exercise Wt/Reps   UBE 5 min retro     R shoulder PROM/stretching DONE   Wand shoulder flex   1#, 2x10   Wand shoulder ER/IR   1#, 10     Supine scap punches 20   Shoulder/scap stabs: circles CW/CCW 15 each way   Standing wall slides: flex & abd 10 each   Standing shoulder TB Row Green, 10   Standing TB B shoulder Extension w/ scap squeeze Green, 10   Standing TB shoulder IR Orange, 10   Sidelying shoulder ER 15           Patient Education:    Reviewed proper form w/ each exercise.  Reviewed rationale for modality/treatment.      Rationale for the above treatment:Decrease Pain  Decrease Inflammation  Increase Soft Tissue Extensibility  Improve Mobility  Improve Posture     Assessment:      Pt had pain w/ reaching overhead (flex/abd) & with ER w/ resistance. She was unable  to perform TB ER, but could perform SL shoulder ER against gravity, but no additional weight. She is specifically tender over subdeltoid bursa & anterior R shoulder.    Plan:     Continue Therapy per Plan of Care.  Assess response to exercises & stretching/manual work.  Add exercises & stretching as tolerated.

## 2020-02-25 ENCOUNTER — Inpatient Hospital Stay: Admit: 2020-02-25 | Payer: BLUE CROSS/BLUE SHIELD | Primary: Internal Medicine

## 2020-02-25 NOTE — Progress Notes (Signed)
 Physical Therapy Daily Treatment       Patient: Katherine Keller  Patient DOB verified :Yes  DOB:  1960/10/22  Visit #: 5 of 12 (Insurance exp: 07/11/20)  Total Treatment Time: 40 minutes  Total Session Time:  40 minutes  Diagnosis: Pain in right shoulder [M25.511], RTC strain  Treatment Diagnosis: R RTC strain, R shoulder pain, decreased R shoulder ROM, decreased R shoulder/RTC strength, decreased functional use of R UE, decreased posture, difficulty sleeping.  Onset: 2016  Treatment Area: R shoulder/scap/neck/RTC, postural    Subjective:     Subjective functional status/changes: Pt reports she has worked 8 days in a row & her arm is so painful, it's bad, an 8/10. She notes it feels better w/ PT treatment, but then the pain comes back. It feels swollen today.    Pain Level (0-10 scale) current: 8/10     Objective:     Treatment Program:    Therapeutic exercises (see chart below) for R shoulder mobility, stretching, & strengthening/stabilization.  R shoulder PROM, stretching, gentle joint mobilization as tolerated.  Added TB shoulder/scap stabilization exercises. (0 min)  Pulsed US  50%, 1.8 w/cm2 x 15 min to R shoulder/arm/R side neck (seated).  CP/Electrical stimulation x 15 min to R shoulder/scap/upper arm.  Iontophoresis patch w/ 1.3 mL dexamethasone to R subdeltoid bursa.     02/25/2020     Exercise Wt/Reps   UBE 0 min retro     R shoulder PROM/stretching DONE   Wand shoulder flex   1#, 2x0   Wand shoulder ER/IR   1#, 0     Supine scap punches 0   Shoulder/scap stabs: circles CW/CCW 0 each way   Standing wall slides: flex & abd 0 each   Standing shoulder TB Row Green, 0   Standing TB B shoulder Extension w/ scap squeeze Green, 0   Standing TB shoulder IR Orange, 0   Sidelying shoulder ER 0           Patient Education:    Reviewed rationale for modality/treatment.      Rationale for the above treatment:Decrease Pain  Decrease Inflammation  Increase Soft Tissue Extensibility  Improve Mobility  Improve  Posture     Assessment:      Pt has high level of pain today & has been working 8 days straight & has 4 more to go.  She is significantly tender & swollen over subdeltoid bursa & anterior R shoulder. Decided to hold on exercises & return to modalities to calm her symptoms down.  She has worked 8 days straight & needs to manage 4 more days before she has a day off.    Plan:     Continue Therapy per Plan of Care.  Assess response to modalities.  Add exercises & stretching as tolerated.

## 2020-02-28 ENCOUNTER — Inpatient Hospital Stay: Admit: 2020-02-28 | Payer: BLUE CROSS/BLUE SHIELD | Primary: Internal Medicine

## 2020-02-28 NOTE — Progress Notes (Signed)
 Physical Therapy Daily Treatment       Patient: Katherine Keller  Patient DOB verified :Yes  DOB:  [birth year]  Visit #: 6 of 12 (Insurance exp: 07/11/20)  Total Treatment Time: 45 minutes  Total Session Time:  45 minutes  Diagnosis: Pain in right shoulder [M25.511], RTC strain  Treatment Diagnosis: R RTC strain, R shoulder pain, decreased R shoulder ROM, decreased R shoulder/RTC strength, decreased functional use of R UE, decreased posture, difficulty sleeping.  Onset: 2016  Treatment Area: R shoulder/scap/neck/RTC, postural    Subjective:     Subjective functional status/changes: Pt reports, This pain is horrible, it's constant.  It hurts all day at work, it hurts at night, I'm not sleeping. I'm bitchy.  I'm over it.  Nothing is making it better. I'm DONE.  Pt gets short term relief with PT, but returns to same pain.  Pt notes she will call MD office to see if she can schedule a follow-up to discuss this.    Pain Level (0-10 scale) current: 8/10     Objective:     Treatment Program:    Therapeutic exercises (see chart below) for R shoulder mobility, stretching, & strengthening/stabilization.  R shoulder PROM, stretching, gentle joint mobilization as tolerated.  Added TB shoulder/scap stabilization exercises. (0 min)  Pulsed US  50%, 1.8 w/cm2 x 15 min to R shoulder/arm/R side neck (seated).  Iontophoresis w/ 2.5 mL dexamethasone at 40 mA.min to R subdeltoid bursa & anterior R shoulder.(15 min)  CP/Electrical stimulation x 15 min to R shoulder/scap/upper arm.       02/28/2020     Exercise Wt/Reps   UBE 0 min retro     R shoulder PROM/stretching DONE   Wand shoulder flex   1#, 2x0   Wand shoulder ER/IR   1#, 0     Supine scap punches 0   Shoulder/scap stabs: circles CW/CCW 0 each way   Standing wall slides: flex & abd 0 each   Standing shoulder TB Row Green, 0   Standing TB B shoulder Extension w/ scap squeeze Green, 0   Standing TB shoulder IR Orange, 0   Sidelying shoulder ER 0           Patient Education:     Reviewed rationale for modality/treatment.      Rationale for the above treatment:Decrease Pain  Decrease Inflammation  Increase Soft Tissue Extensibility  Improve Mobility  Improve Posture     Assessment:      Pt has high level of pain today 8/10 & has been working 11 days straight & has 1 more to go.  She is significantly painful, tender & swollen in R shoulder & upper arm (over subdeltoid bursa, anterior R shoulder, biceps). Held off on exercises today as pt is in high level of pain after night shift (11th shift in a row!).  Doing modalities to calm her symptoms down.  R shoulder ROM is significantly limited today (due to pain):  flexion 90, ABD 80, ER 40.  Pt is frustrated.  Her pain is constant, high level, during day & night, not getting any relief. She is exhausted & seems to have reached her pain limit.  She will be calling MD for follow-up appointment.  We will continue PT to help reduce pain until follow-up.    Plan:     Continue Therapy per Plan of Care.  Assess response to modalities.  Add exercises & stretching as tolerated.

## 2020-03-03 ENCOUNTER — Encounter: Payer: BLUE CROSS/BLUE SHIELD | Primary: Internal Medicine

## 2020-03-06 ENCOUNTER — Encounter

## 2020-03-06 ENCOUNTER — Encounter: Payer: BLUE CROSS/BLUE SHIELD | Primary: Internal Medicine

## 2020-03-10 ENCOUNTER — Inpatient Hospital Stay: Admit: 2020-03-10 | Payer: BLUE CROSS/BLUE SHIELD | Primary: Internal Medicine

## 2020-03-10 NOTE — Progress Notes (Signed)
 Physical Therapy Daily Treatment       Patient: Makaylynn Brownrigg  Patient DOB verified :Yes  DOB:  Sep 20, 1960  Visit #: 7 of 12 (Insurance exp: 07/11/20)  Total Treatment Time: 30 minutes  Total Session Time:  35 minutes  Diagnosis: Pain in right shoulder [M25.511], RTC strain  Treatment Diagnosis: R RTC strain, R shoulder pain, decreased R shoulder ROM, decreased R shoulder/RTC strength, decreased functional use of R UE, decreased posture, difficulty sleeping.  Onset: 2016  Treatment Area: R shoulder/scap/neck/RTC, postural    Subjective:     Subjective functional status/changes: Pt reports she missed PT multiple times in past 1.5 weeks due to wisdom tooth extraction & having to see ankle/foot doctor (they put her in a boot & ordered an ankle/foot MRI).  I have too much going on.  Pt reports, This shoulder pain is horrible, it's constant.  Notes it hurts all day at work, it hurts at night when trying to sleep. She will be going to Dr Delores on 03/26/20 to discuss the unrelenting R shoulder/arm pain.    Pain Level (0-10 scale) current: 8/10     Objective:     Treatment Program:    Therapeutic exercises (see chart below) for R shoulder mobility, stretching, & strengthening/stabilization.  R shoulder PROM, stretching, gentle joint mobilization as tolerated.  Added TB shoulder/scap stabilization exercises. (0 min)  Pulsed US  50%, 1.8 w/cm2 x 15 min to R shoulder/arm/R side neck (seated).  Iontophoresis w/ 2.5 mL dexamethasone at 40 mA.min to R subdeltoid bursa & anterior R shoulder x15 min.  CP x 15 min to R shoulder/scap/upper arm/neck.       03/10/2020     Exercise Wt/Reps   UBE 0 min retro     R shoulder PROM/stretching DONE   Wand shoulder flex   1#, 2x0   Wand shoulder ER/IR   1#, 0     Supine scap punches 0   Shoulder/scap stabs: circles CW/CCW 0 each way   Standing wall slides: flex & abd 0 each   Standing shoulder TB Row Green, 0   Standing TB B shoulder Extension w/ scap squeeze Green, 0   Standing TB  shoulder IR Orange, 0   Sidelying shoulder ER 0           Patient Education:    Reviewed rationale for modality/treatment.      Rationale for the above treatment:Decrease Pain  Decrease Inflammation  Increase Soft Tissue Extensibility  Improve Mobility  Improve Posture     Assessment:      Pt has worked 19 consecutive days & finally had this past weekend off.  She missed all PT appointments over past 1.5 weeks due to dental work & foot/ankle issue & need to go to MD.  She has high level of pain today 8/10 & is significantly painful, tender & swollen in R shoulder & upper arm (over subdeltoid bursa, anterior R shoulder, biceps). Held off on exercises today as pt is in high level of pain after night shift.  Doing modalities to calm her symptoms down.  R shoulder ROM is significantly limited today (due to pain).  Pt is frustrated that her pain is constant, high level, during day & night, not getting any relief. She is exhausted because her job is physical & she continues to work hard.   She will be seeing Dr Delores on 03/26/20.     Plan:     Continue Therapy per Plan of Care.  Assess response  to modalities.  Add exercises & stretching as tolerated.

## 2020-03-18 ENCOUNTER — Encounter

## 2020-03-18 DIAGNOSIS — S46011S Strain of muscle(s) and tendon(s) of the rotator cuff of right shoulder, sequela: Secondary | ICD-10-CM

## 2020-03-18 NOTE — Progress Notes (Signed)
Pt did not show for scheduled PT session.

## 2020-03-19 ENCOUNTER — Inpatient Hospital Stay: Payer: BLUE CROSS/BLUE SHIELD | Primary: Internal Medicine

## 2020-03-20 ENCOUNTER — Inpatient Hospital Stay: Admit: 2020-03-20 | Payer: BLUE CROSS/BLUE SHIELD | Primary: Internal Medicine

## 2020-03-20 NOTE — Progress Notes (Signed)
Progress Notes by Benjie Karvonen, PT at 03/20/20 1000                Author: Benjie Karvonen, PT  Service: --  Author Type: Physical Therapist       Filed: 03/20/20 1739  Date of Service: 03/20/20 1000  Status: Signed          Editor: Benjie Karvonen, PT (Physical Therapist)                       [address] [place], [place] [address]   [phone]   _______________________________________________________________________________      PHYSICAL THERAPY    DISCHARGE SUMMARY      Patient Name :   Katherine Keller    DOB:    18-Jan-1961     Physician Name:  Hillary Bow, MD   Diagnosis:   Pain in right shoulder [M25.511]   Treatment Date(s):  01/22/20 to 03/20/20    Treatment Program: R shoulder/scapula/RTC mobility, stretching, scapular stabilization/RTC strengthening, manual,  modalities, & HEP      Subjective: Pt reports, "This shoulder pain is horrible, it's constant."  She notes R shoulder/arm hurts  all day at work, it hurts at night when trying to sleep. "I'm over it.  I want them to do the surgery because I can't handle this anymore".  She will be going to Dr Manson Passey on 03/26/20 to discuss the unrelenting R shoulder/arm pain.  She has canceled  her remaining PT appointments.   Pt reports she saw spine specialist & he said neck was clear & after he reviewed pt's R shoulder MRI he stated her pain is definitely coming from the shoulder.      Patient Progress: Pt has high level of R shoulder pain 8/10 & is significantly painful, tender & swollen in R shoulder  & upper arm (over subdeltoid bursa, anterior R shoulder, biceps).    She has constant shoulder pain at work, home & while sleeping, & this is frustrating her as it is significantly effecting her daily life.    R shoulder ROM is significantly limited: flexion 110, abduction 85, ER 30.   Pt has made no progress.   She will be seeing Dr Manson Passey on 03/26/20 & wants to discuss surgery.      Recommendations D/C from PT.  Follow-up w/ Dr  Manson Passey on 03/26/20.         If you have any question, please do not hesitate to contact me at [phone].          Signature:  Electronically signed by Benjie Karvonen, MPT                                                  PHYSICIAN ORDER                  1. _______ Discontinue Physical Therapy    2. _______ Continue Physical Therapy as above for _____ weeks.    3. _______ Continue Physical Therapy and change the treatment                       as follows: ____________________________________         Physician Signature ___________________________  Date/Time _________________      Please sign, date and return  to [facility]    Out-Patient Physical Therapy Department - Fax [phone]

## 2020-03-20 NOTE — Progress Notes (Signed)
 Physical Therapy Daily Treatment [place]      Patient: Katherine Keller  Patient DOB verified :Yes  DOB:  [birth year]  Visit #: 8 of 12 (Insurance exp: 07/11/20)  Treatment Period:   01/22/20-03/20/20  Total Treatment Time: 25 minutes  Total Session Time:  40 minutes  Diagnosis: Pain in right shoulder [M25.511], RTC strain  Treatment Diagnosis: R RTC strain, R shoulder pain, decreased R shoulder ROM, decreased R shoulder/RTC strength, decreased functional use of R UE, decreased posture, difficulty sleeping.  Onset: 2016  Treatment Area: R shoulder/scap/neck/RTC, postural    Subjective:     Subjective functional status/changes: Pt reports, This shoulder pain is horrible, it's constant.  She notes R shoulder/arm hurts all day at work, it hurts at night when trying to sleep. I'm over it.  I want them to do the surgery because I can't handle this anymore.  She will be going to Dr Delores on 03/26/20 to discuss the unrelenting R shoulder/arm pain.  She has canceled her remaining PT appointments.  Pt reports she saw spine specialist & he said neck was clear & after he reviewed pt's R shoulder MRI he stated her pain is definitely coming from the shoulder.    Pain Level (0-10 scale) current: 8/10     Objective:     Treatment Program:    Discussed pt's frustration with constant high level of R shoulder/arm pain at work/home/sleeping & inability to get any relief for functional ADL & work duties. Reassessment of pt (10 min).  Therapeutic exercises (see chart below) for R shoulder mobility, stretching, & strengthening/stabilization.  R shoulder PROM, stretching, gentle joint mobilization as tolerated.  Added TB shoulder/scap stabilization exercises. (0 min)  Pulsed US  50%, 1.8 w/cm2 x 15 min to R shoulder/arm/R side neck (seated).  CP x 15 min to R shoulder/scap/upper arm/neck.       03/20/2020     Exercise Wt/Reps   UBE 0 min retro     R shoulder PROM/stretching DONE   Wand shoulder flex   1#, 2x0   Wand  shoulder ER/IR   1#, 0     Supine scap punches 0   Shoulder/scap stabs: circles CW/CCW 0 each way   Standing wall slides: flex & abd 0 each   Standing shoulder TB Row Green, 0   Standing TB B shoulder Extension w/ scap squeeze Green, 0   Standing TB shoulder IR Orange, 0   Sidelying shoulder ER 0           Patient Education:    Reviewed positioning/support to help minimize pain.      Rationale for the above treatment:Decrease Pain  Decrease Inflammation  Increase Soft Tissue Extensibility  Improve Mobility  Improve Posture     Assessment:      Pt has high level of pain 8/10 & is significantly painful, tender & swollen in R shoulder & upper arm (over subdeltoid bursa, anterior R shoulder, biceps).   She has constant shoulder pain at work, come & while sleeping.  It is significantly effecting her daily life.   R shoulder ROM is significantly limited: flexion 110, abduction 85, ER 30.  Pt is frustrated that she has made no progress.  She will be seeing Dr Delores on 03/26/20 & wants to discuss surgery.    Plan:     D/C from PT at this time.  Follow-up w/ Dr Delores on 03/26/20.

## 2020-03-24 ENCOUNTER — Ambulatory Visit: Payer: BLUE CROSS/BLUE SHIELD | Primary: Internal Medicine

## 2020-03-25 ENCOUNTER — Encounter: Payer: BLUE CROSS/BLUE SHIELD | Primary: Internal Medicine

## 2020-03-27 ENCOUNTER — Encounter: Payer: BLUE CROSS/BLUE SHIELD | Primary: Internal Medicine

## 2020-04-08 ENCOUNTER — Inpatient Hospital Stay: Admit: 2020-04-08 | Payer: BLUE CROSS/BLUE SHIELD | Primary: Internal Medicine

## 2020-04-08 ENCOUNTER — Encounter

## 2020-04-08 DIAGNOSIS — G894 Chronic pain syndrome: Secondary | ICD-10-CM

## 2020-04-09 ENCOUNTER — Inpatient Hospital Stay: Admit: 2020-04-09 | Payer: BLUE CROSS/BLUE SHIELD | Attending: Foot & Ankle Surgery | Primary: Internal Medicine

## 2020-04-09 DIAGNOSIS — M7662 Achilles tendinitis, left leg: Secondary | ICD-10-CM

## 2020-04-09 LAB — METABOLIC PANEL, COMPREHENSIVE
ALT (SGPT): 30 U/L (ref 12–78)
AST (SGOT): 16 U/L (ref 15–37)
Albumin: 3.9 gm/dl (ref 3.4–5.0)
Alk. phosphatase: 70 U/L (ref 45–117)
Anion gap: 8 mmol/L (ref 5–15)
BUN: 18 mg/dl (ref 7–25)
Bilirubin, total: 0.8 mg/dl (ref 0.2–1.0)
CO2: 24 mEq/L (ref 21–32)
Calcium: 9.1 mg/dl (ref 8.5–10.1)
Chloride: 107 mEq/L (ref 98–107)
Creatinine: 0.6 mg/dl (ref 0.6–1.3)
GFR est AA: >60
GFR est non-AA: >60
Glucose: 77 mg/dl (ref 74–106)
Potassium: 3.9 mEq/L (ref 3.5–5.1)
Protein, total: 7.7 gm/dl (ref 6.4–8.2)
Sodium: 139 mEq/L (ref 136–145)

## 2020-04-09 LAB — URINALYSIS W/ RFLX MICROSCOPIC
Bilirubin, Urine: NEGATIVE
Bilirubin: NEGATIVE
Blood, Urine: NEGATIVE
Blood: NEGATIVE
Glucose, Ur: NEGATIVE mg/dl
Glucose: NEGATIVE mg/dl
Ketone: NEGATIVE mg/dl
Ketones, Urine: NEGATIVE mg/dl
Leukocyte Esterase, Urine: NEGATIVE
Leukocyte Esterase: NEGATIVE
Nitrite, Urine: NEGATIVE
Nitrites: NEGATIVE
Protein, UA: NEGATIVE mg/dl
Protein: NEGATIVE mg/dl
Specific Gravity, UA: >=1.03 (ref 1.005–1.030)
Specific gravity: >=1.03 (ref 1.005–1.030)
Urobilinogen, UA, POCT: 0.2 mg/dl (ref 0.0–1.0)
Urobilinogen: 0.2 mg/dl (ref 0.0–1.0)
pH (UA): 5 (ref 5.0–9.0)
pH, UA: 5 (ref 5.0–9.0)

## 2020-04-09 LAB — CBC WITH AUTOMATED DIFF
BASOPHILS: 0.4 % (ref 0–3)
EOSINOPHILS: 0.3 % (ref 0–5)
HCT: 41.6 % (ref 37.0–50.0)
HGB: 12.7 gm/dl — ABNORMAL LOW (ref 13.0–17.2)
IMMATURE GRANULOCYTES: 0.1 % (ref 0.0–3.0)
LYMPHOCYTES: 22.6 % — ABNORMAL LOW (ref 28–48)
MCH: 25.7 pg (ref 25.4–34.6)
MCHC: 30.5 gm/dl (ref 30.0–36.0)
MCV: 84.2 fL (ref 80.0–98.0)
MONOCYTES: 5.6 % (ref 1–13)
MPV: 10.9 fL — ABNORMAL HIGH (ref 6.0–10.0)
NEUTROPHILS: 71 % — ABNORMAL HIGH (ref 34–64)
NRBC: 0 (ref 0–0)
PLATELET: 382 10*3/uL (ref 140–450)
RBC: 4.94 M/uL (ref 3.60–5.20)
RDW-SD: 41.3 (ref 36.4–46.3)
WBC: 7.5 10*3/uL (ref 4.0–11.0)

## 2020-04-09 LAB — MICROALBUMIN, UR, RAND W/ MICROALB/CREAT RATIO
Creatinine, urine random: 119 mg/dl (ref 30.0–125.0)
Microalbumin,urine random: 9.9 mg/L (ref 0.0–29.9)
Microalbumin/Creat ratio (mg/g creat): 8 mg/g (ref 0–30)

## 2020-04-09 LAB — LIPID PANEL
CHOL/HDL Ratio: 2.7 Ratio (ref 0.0–4.4)
Chol/HDL Ratio: 2.7 Ratio (ref 0.0–4.4)
Cholesterol, Total: 200 mg/dl — ABNORMAL HIGH (ref 140–199)
Cholesterol, total: 200 mg/dl — ABNORMAL HIGH (ref 140–199)
HDL Cholesterol: 73 mg/dl (ref 40–96)
HDL: 73 mg/dl (ref 40–96)
LDL Calculated: 119 mg/dl (ref 0–130)
LDL, calculated: 119 mg/dl (ref 0–130)
Triglyceride: 39 mg/dl (ref 29–150)
Triglycerides: 39 mg/dl (ref 29–150)

## 2020-04-09 LAB — T4, FREE
FREE T4, FT4T: 1.09 ng/dl (ref 0.76–1.46)
Free T4: 1.09 ng/dl (ref 0.76–1.46)

## 2020-04-09 LAB — VITAMIN D, 25 HYDROXY: Vitamin D, 25-OH, Total: 25.2 ng/ml — ABNORMAL LOW (ref 30.0–100.0)

## 2020-04-09 LAB — THYROID CASCADE PROFILE
TSH: 1.63 m[IU]/mL (ref 0.358–3.740)
TSH: 1.63 m[IU]/mL (ref 0.358–3.740)

## 2020-04-09 LAB — HEMOGLOBIN A1C W/O EAG
Hemoglobin A1C: 5.7 % — ABNORMAL HIGH (ref 4.2–5.6)
Hemoglobin A1c: 5.7 % — ABNORMAL HIGH (ref 4.2–5.6)

## 2020-04-09 LAB — CREATININE, UR, RANDOM
CREATININE, URINE RANDOM: 119 mg/dl (ref 30.0–125.0)
Creatinine, urine random: 119 mg/dl (ref 30.0–125.0)

## 2020-04-09 LAB — CBC WITH AUTO DIFFERENTIAL
Basophils %: 0.4 % (ref 0–3)
Eosinophils %: 0.3 % (ref 0–5)
Hematocrit: 41.6 % (ref 37.0–50.0)
Hemoglobin: 12.7 gm/dl — ABNORMAL LOW (ref 13.0–17.2)
Immature Granulocytes: 0.1 % (ref 0.0–3.0)
Lymphocytes %: 22.6 % — ABNORMAL LOW (ref 28–48)
MCH: 25.7 pg (ref 25.4–34.6)
MCHC: 30.5 gm/dl (ref 30.0–36.0)
MCV: 84.2 fL (ref 80.0–98.0)
MPV: 10.9 fL — ABNORMAL HIGH (ref 6.0–10.0)
Monocytes %: 5.6 % (ref 1–13)
Neutrophils %: 71 % — ABNORMAL HIGH (ref 34–64)
Nucleated RBCs: 0 (ref 0–0)
Platelets: 382 10*3/uL (ref 140–450)
RBC: 4.94 M/uL (ref 3.60–5.20)
RDW-SD: 41.3 (ref 36.4–46.3)
WBC: 7.5 10*3/uL (ref 4.0–11.0)

## 2020-04-09 LAB — COMPREHENSIVE METABOLIC PANEL
ALT: 30 U/L (ref 12–78)
AST: 16 U/L (ref 15–37)
Albumin: 3.9 gm/dl (ref 3.4–5.0)
Alkaline Phosphatase: 70 U/L (ref 45–117)
Anion Gap: 8 mmol/L (ref 5–15)
BUN: 18 mg/dl (ref 7–25)
CO2: 24 mEq/L (ref 21–32)
Calcium: 9.1 mg/dl (ref 8.5–10.1)
Chloride: 107 mEq/L (ref 98–107)
Creatinine: 0.6 mg/dl (ref 0.6–1.3)
EGFR IF NonAfrican American: >60
GFR African American: >60
Glucose: 77 mg/dl (ref 74–106)
Potassium: 3.9 mEq/L (ref 3.5–5.1)
Sodium: 139 mEq/L (ref 136–145)
Total Bilirubin: 0.8 mg/dl (ref 0.2–1.0)
Total Protein: 7.7 gm/dl (ref 6.4–8.2)

## 2020-04-09 LAB — VITAMIN D 25 HYDROXY: Vitamin D, 25-OH, Total: 25.2 ng/ml — ABNORMAL LOW (ref 30.0–100.0)

## 2020-04-09 LAB — MICROALBUMIN / CREATININE URINE RATIO
CREATININE, URINE RANDOM: 119 mg/dl (ref 30.0–125.0)
Microalb, Ur: 9.9 mg/L (ref 0.0–29.9)
Microalbumin Creatinine Ratio: 8 mg/g (ref 0–30)

## 2020-05-06 ENCOUNTER — Inpatient Hospital Stay: Admit: 2020-05-06 | Payer: BLUE CROSS/BLUE SHIELD | Attending: Internal Medicine | Primary: Internal Medicine

## 2020-05-06 DIAGNOSIS — Z1231 Encounter for screening mammogram for malignant neoplasm of breast: Secondary | ICD-10-CM

## 2020-09-25 ENCOUNTER — Inpatient Hospital Stay: Admit: 2020-09-25 | Payer: BLUE CROSS/BLUE SHIELD | Primary: Internal Medicine

## 2020-09-25 DIAGNOSIS — E669 Obesity, unspecified: Secondary | ICD-10-CM

## 2020-09-26 LAB — METABOLIC PANEL, COMPREHENSIVE
ALT (SGPT): 22 U/L (ref 12–78)
AST (SGOT): 14 U/L — ABNORMAL LOW (ref 15–37)
Albumin: 3.8 gm/dl (ref 3.4–5.0)
Alk. phosphatase: 83 U/L (ref 45–117)
Anion gap: 3 mmol/L — ABNORMAL LOW (ref 5–15)
BUN: 17 mg/dl (ref 7–25)
Bilirubin, total: 0.9 mg/dl (ref 0.2–1.0)
CO2: 28 mEq/L (ref 21–32)
Calcium: 9.7 mg/dl (ref 8.5–10.1)
Chloride: 108 mEq/L — ABNORMAL HIGH (ref 98–107)
Creatinine: 0.7 mg/dl (ref 0.6–1.3)
GFR est AA: >60
GFR est non-AA: >60
Glucose: 87 mg/dl (ref 74–106)
Potassium: 4.2 mEq/L (ref 3.5–5.1)
Protein, total: 7.4 gm/dl (ref 6.4–8.2)
Sodium: 139 mEq/L (ref 136–145)

## 2020-09-26 LAB — URINALYSIS W/ RFLX MICROSCOPIC
Bilirubin, Urine: NEGATIVE
Bilirubin: NEGATIVE
Glucose, Ur: NEGATIVE mg/dl
Glucose: NEGATIVE mg/dl
Ketone: NEGATIVE mg/dl
Ketones, Urine: NEGATIVE mg/dl
Leukocyte Esterase, Urine: NEGATIVE
Leukocyte Esterase: NEGATIVE
Nitrite, Urine: NEGATIVE
Nitrites: NEGATIVE
Protein, UA: NEGATIVE mg/dl
Protein: NEGATIVE mg/dl
Specific Gravity, UA: 1.025 (ref 1.005–1.030)
Specific gravity: 1.025 (ref 1.005–1.030)
Urobilinogen, UA, POCT: 0.2 mg/dl (ref 0.0–1.0)
Urobilinogen: 0.2 mg/dl (ref 0.0–1.0)
pH (UA): 5.5 (ref 5.0–9.0)
pH, UA: 5.5 (ref 5.0–9.0)

## 2020-09-26 LAB — CBC WITH AUTOMATED DIFF
BASOPHILS: 0.3 % (ref 0–3)
EOSINOPHILS: 2.5 % (ref 0–5)
HCT: 42 % (ref 37.0–50.0)
HGB: 12.9 gm/dl — ABNORMAL LOW (ref 13.0–17.2)
IMMATURE GRANULOCYTES: 0.1 % (ref 0.0–3.0)
LYMPHOCYTES: 33.6 % (ref 28–48)
MCH: 25.9 pg (ref 25.4–34.6)
MCHC: 30.7 gm/dl (ref 30.0–36.0)
MCV: 84.3 fL (ref 80.0–98.0)
MONOCYTES: 8.1 % (ref 1–13)
MPV: 10.8 fL — ABNORMAL HIGH (ref 6.0–10.0)
NEUTROPHILS: 55.4 % (ref 34–64)
NRBC: 0 (ref 0–0)
PLATELET: 313 10*3/uL (ref 140–450)
RBC: 4.98 M/uL (ref 3.60–5.20)
RDW-SD: 41.9 (ref 36.4–46.3)
WBC: 6.7 10*3/uL (ref 4.0–11.0)

## 2020-09-26 LAB — THYROID CASCADE PROFILE
TSH: 1.47 m[IU]/mL (ref 0.358–3.740)
TSH: 1.47 m[IU]/mL (ref 0.358–3.740)

## 2020-09-26 LAB — POC URINE MICROSCOPIC

## 2020-09-26 LAB — LIPID PANEL
CHOL/HDL Ratio: 3.8 Ratio (ref 0.0–4.4)
Chol/HDL Ratio: 3.8 Ratio (ref 0.0–4.4)
Cholesterol, Total: 217 mg/dl — ABNORMAL HIGH (ref 140–199)
Cholesterol, total: 217 mg/dl — ABNORMAL HIGH (ref 140–199)
HDL Cholesterol: 57 mg/dl (ref 40–96)
HDL: 57 mg/dl (ref 40–96)
LDL Calculated: 137 mg/dl — ABNORMAL HIGH (ref 0–130)
LDL, calculated: 137 mg/dl — ABNORMAL HIGH (ref 0–130)
Triglyceride: 116 mg/dl (ref 29–150)
Triglycerides: 116 mg/dl (ref 29–150)

## 2020-09-26 LAB — VITAMIN D, 25 HYDROXY: Vitamin D, 25-OH, Total: 24.6 ng/ml — ABNORMAL LOW (ref 30.0–100.0)

## 2020-09-26 LAB — HEMOGLOBIN A1C W/O EAG
Hemoglobin A1C: 5.5 % (ref 4.2–5.6)
Hemoglobin A1c: 5.5 % (ref 4.2–5.6)

## 2020-09-26 LAB — CBC WITH AUTO DIFFERENTIAL
Basophils %: 0.3 % (ref 0–3)
Eosinophils %: 2.5 % (ref 0–5)
Hematocrit: 42 % (ref 37.0–50.0)
Hemoglobin: 12.9 gm/dl — ABNORMAL LOW (ref 13.0–17.2)
Immature Granulocytes: 0.1 % (ref 0.0–3.0)
Lymphocytes %: 33.6 % (ref 28–48)
MCH: 25.9 pg (ref 25.4–34.6)
MCHC: 30.7 gm/dl (ref 30.0–36.0)
MCV: 84.3 fL (ref 80.0–98.0)
MPV: 10.8 fL — ABNORMAL HIGH (ref 6.0–10.0)
Monocytes %: 8.1 % (ref 1–13)
Neutrophils %: 55.4 % (ref 34–64)
Nucleated RBCs: 0 (ref 0–0)
Platelets: 313 10*3/uL (ref 140–450)
RBC: 4.98 M/uL (ref 3.60–5.20)
RDW-SD: 41.9 (ref 36.4–46.3)
WBC: 6.7 10*3/uL (ref 4.0–11.0)

## 2020-09-26 LAB — COMPREHENSIVE METABOLIC PANEL
ALT: 22 U/L (ref 12–78)
AST: 14 U/L — ABNORMAL LOW (ref 15–37)
Albumin: 3.8 gm/dl (ref 3.4–5.0)
Alkaline Phosphatase: 83 U/L (ref 45–117)
Anion Gap: 3 mmol/L — ABNORMAL LOW (ref 5–15)
BUN: 17 mg/dl (ref 7–25)
CO2: 28 mEq/L (ref 21–32)
Calcium: 9.7 mg/dl (ref 8.5–10.1)
Chloride: 108 mEq/L — ABNORMAL HIGH (ref 98–107)
Creatinine: 0.7 mg/dl (ref 0.6–1.3)
EGFR IF NonAfrican American: >60
GFR African American: >60
Glucose: 87 mg/dl (ref 74–106)
Potassium: 4.2 mEq/L (ref 3.5–5.1)
Sodium: 139 mEq/L (ref 136–145)
Total Bilirubin: 0.9 mg/dl (ref 0.2–1.0)
Total Protein: 7.4 gm/dl (ref 6.4–8.2)

## 2020-09-26 LAB — VITAMIN D 25 HYDROXY: Vitamin D, 25-OH, Total: 24.6 ng/ml — ABNORMAL LOW (ref 30.0–100.0)

## 2022-11-18 IMAGING — MR MRI CERVICAL SPINE WITHOUT CONTRAST
3 of 6 series · 9 of 48 positions shown · IV contrast (gadolinium)
Comparison: X-ray cervical spine from October 21, 2022.

MRI CERVICAL SPINE WITHOUT CONTRAST , 11/18/2022 [DATE]: 
CLINICAL INDICATION: Neck pain.
TECHNIQUE: Multiplanar, multiecho position MR images of the cervical spine were 
performed without intravenous gadolinium enhancement. Patient was scanned on a 
3.0 T magnet.

[Series 2: T2 · sagittal · 3.0mm · 0.39mm/px · 3 of 18 slices shown (1 of 3)]
[im 3/18]
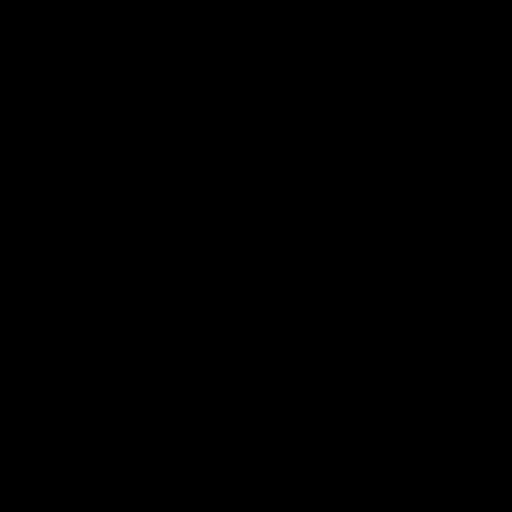
[im 9/18]
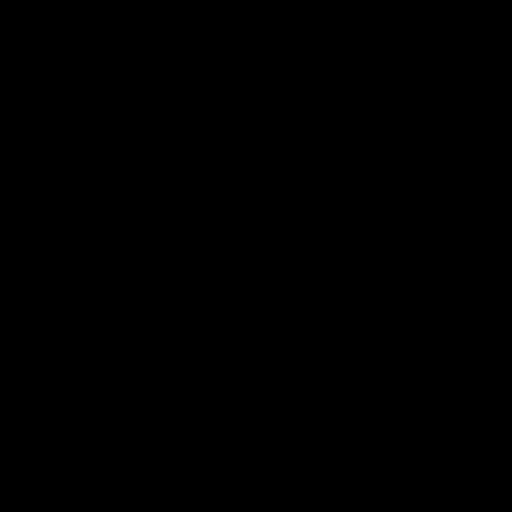
[im 15/18]
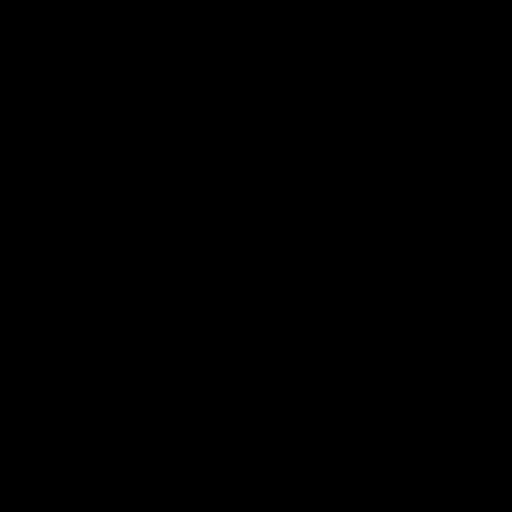

[Series 5: T2 · axial · 4.0mm · 0.27mm/px · z∈[-76,-1]mm · 3 of 24 slices shown (2 of 3)]
[im 3/24]
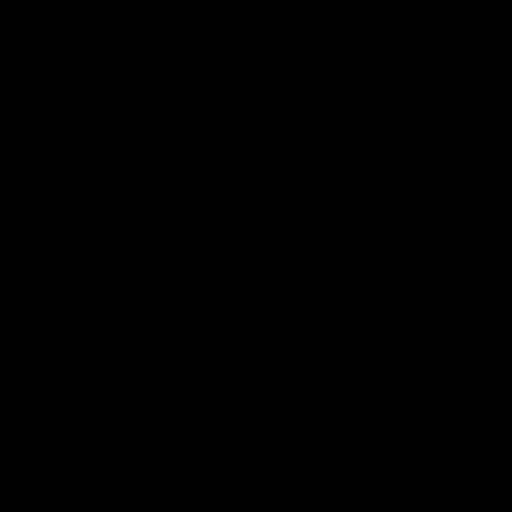
[im 13/24]
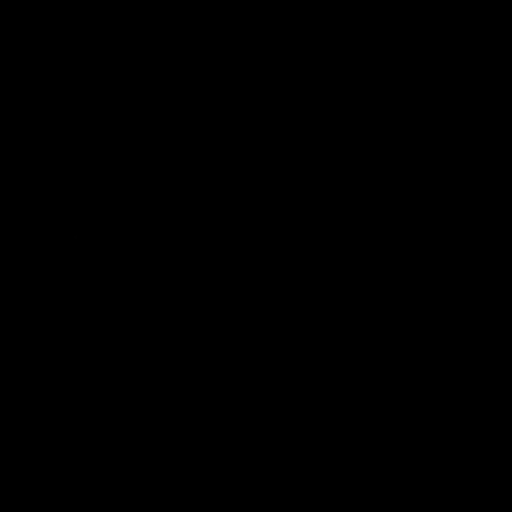
[im 21/24]
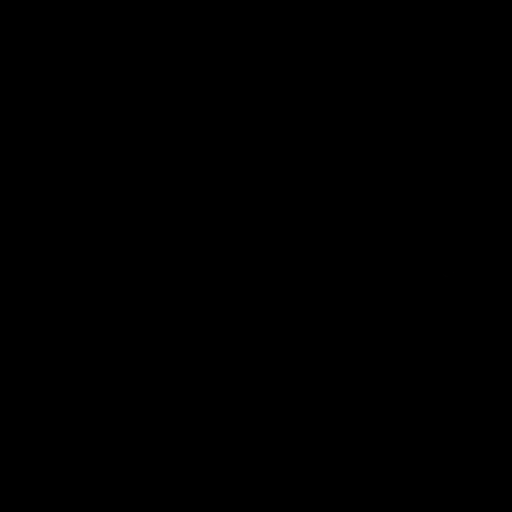

[Series 7: T2 · axial · 3.0mm · 0.14mm/px · z∈[-73,-15]mm · 3 of 18 slices shown (3 of 3)]
[im 3/18]
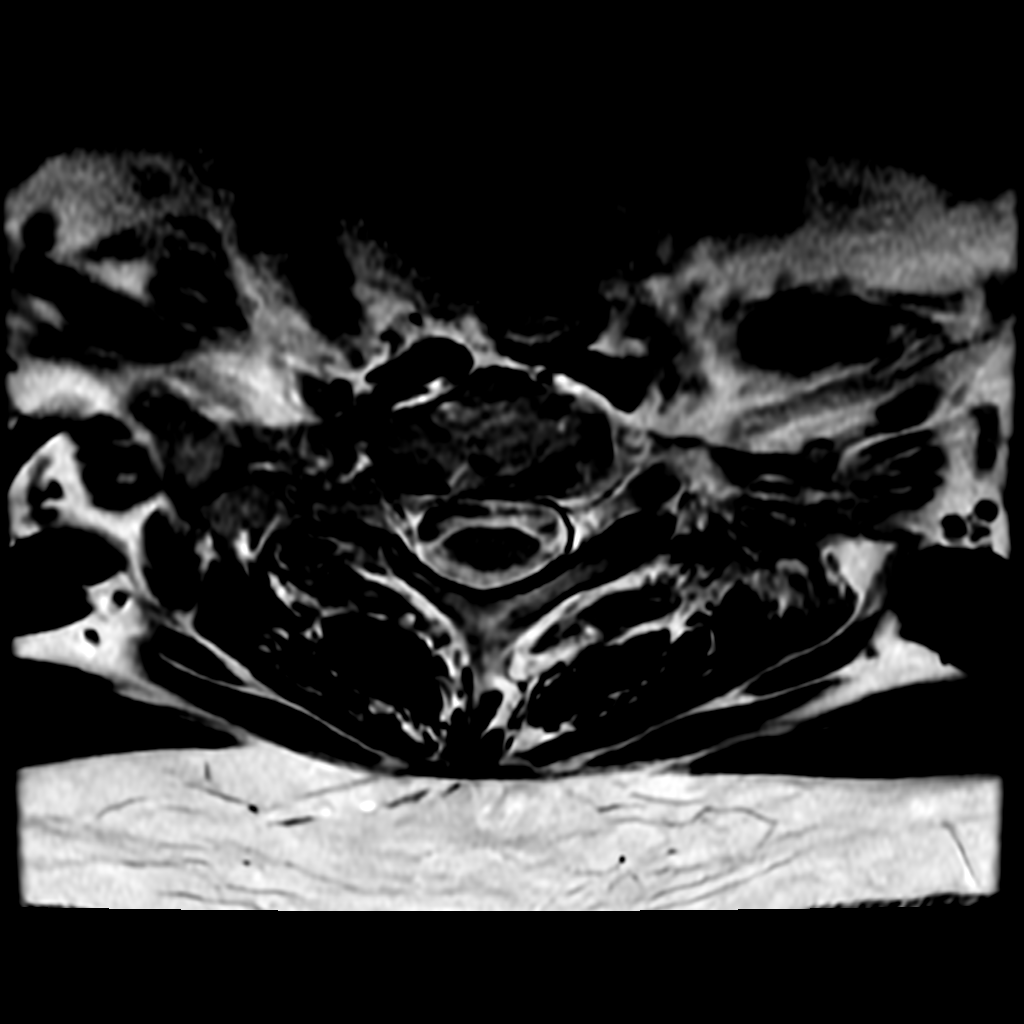
[im 9/18]
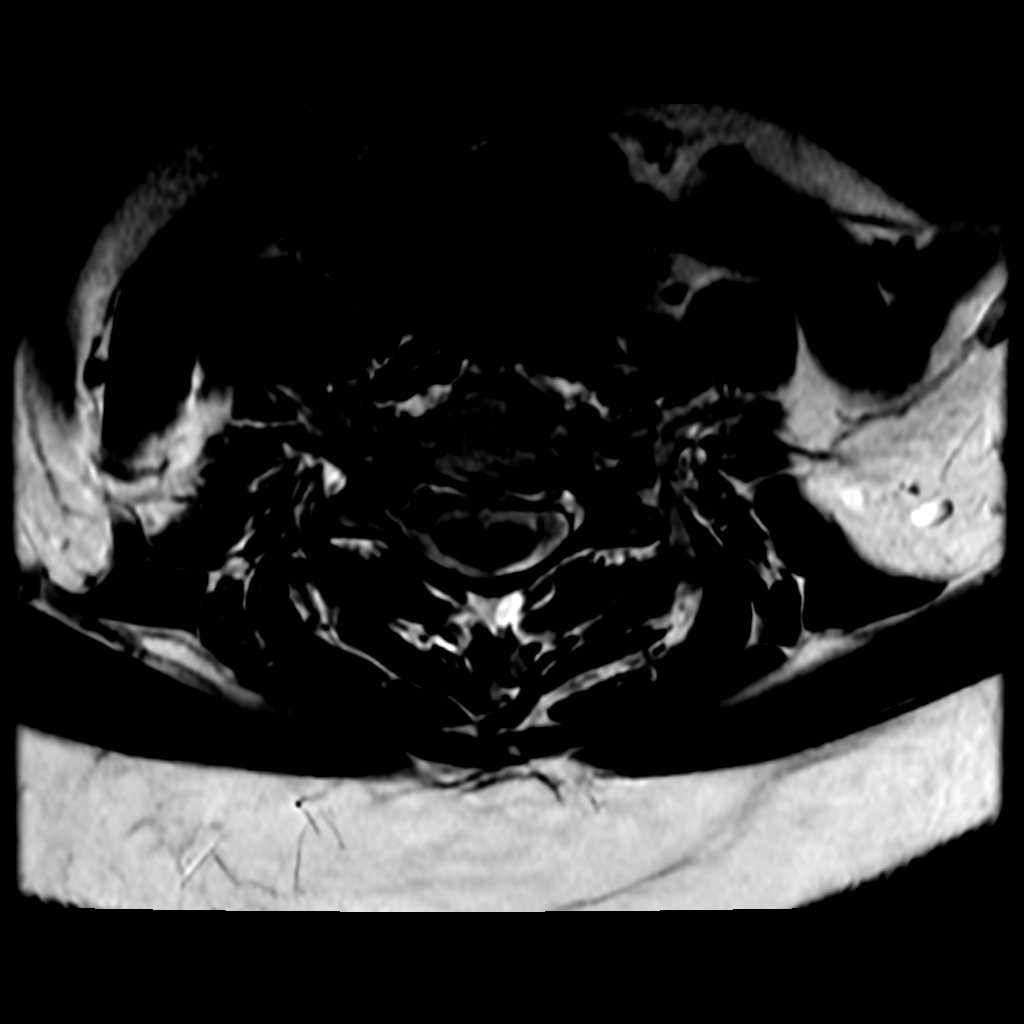
[im 15/18]
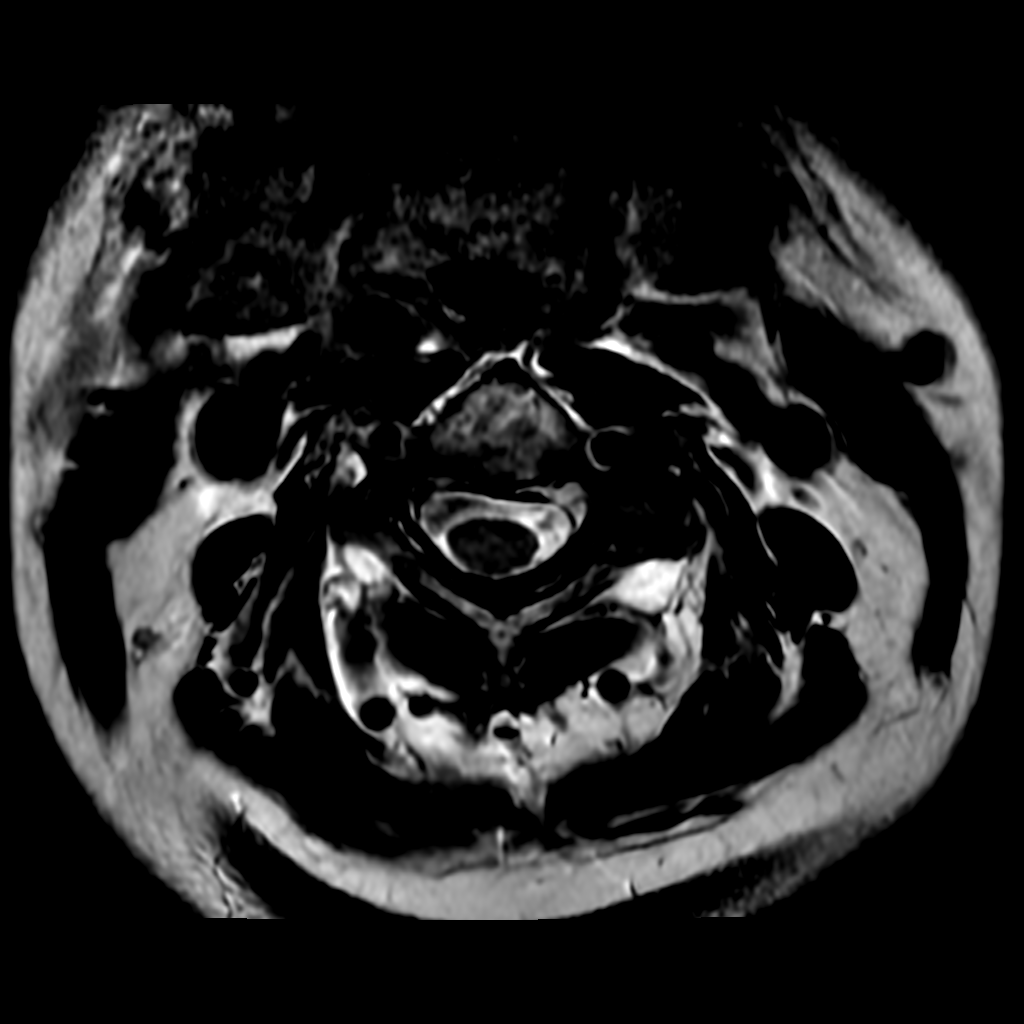

[9 of 48 positions shown; findings below may reference images not displayed]

FINDINGS: -------------------------------------------------------------------------------- 
----------------- 
GENERAL: 
ALIGNMENT: Normal. 
VERTEBRAL BODY HEIGHT: Normal.  
MARROW SIGNAL: No focal suspect signal abnormality. 
CORD SIGNAL: Normal.  
ADDITIONAL FINDINGS: None. 
-------------------------------------------------------------------------------- 
---------------- 
SEGMENTAL: 
CRANIOCERVICAL JUNCTION: No significant stenosis. 
C2-C3: Left facet hypertrophy. No significant central canal or neural foraminal 
narrowing.  
C3-C4: Disc osteophyte complex with left uncovertebral joint hypertrophy. Left 
facet hypertrophy. Mild central canal narrowing. Mild left neural foraminal 
narrowing. No significant right neural foraminal narrowing.  
C4-C5: Disc osteophyte complex with left uncovertebral joint hypertrophy. Mild 
deformity of the ventral cord. Mild central canal narrowing. No significant 
right neural foraminal narrowing. Moderate left neural foraminal narrowing. 
C5-C6: Mild disc osteophyte complex. Bilateral uncovertebral joint hypertrophy. 
Mild central canal narrowing. Mild bilateral neural foraminal narrowing. 
C6-C7: Disc osteophyte complex with bilateral uncovertebral joint hypertrophy. 
Mild central canal narrowing. Mild left and no significant right neural 
foraminal narrowing. 
C7-T1: Mild disc osteophyte complex. No significant central canal or neural 
foraminal narrowing.  
-------------------------------------------------------------------------------- 
---------------
IMPRESSION: 1.  Discogenic/degenerative changes as above. 
2.  Cord signal abnormality: None. 
3.  Cord deformity: C4-C5 
4.  Severe neural foraminal narrowing: None.

## 2022-11-18 IMAGING — MR MRI LUMBAR WITHOUT CONTRAST
2 of 8 series · 5 of 48 positions shown · non-contrast
Comparison: Lumbar radiograph July 26, 2022

MRI LUMBAR WITHOUT CONTRAST , 11/18/2022 [DATE]: 
CLINICAL INDICATION: Low back and tail bone pain
TECHNIQUE: Multiplanar, multiecho position MR images of the lumbar spine were 
performed without contrast.

[Series 10: T2 · sagittal · 4.0mm · 0.26mm/px · 3 of 17 slices shown (1 of 2)]
[im 1/17]
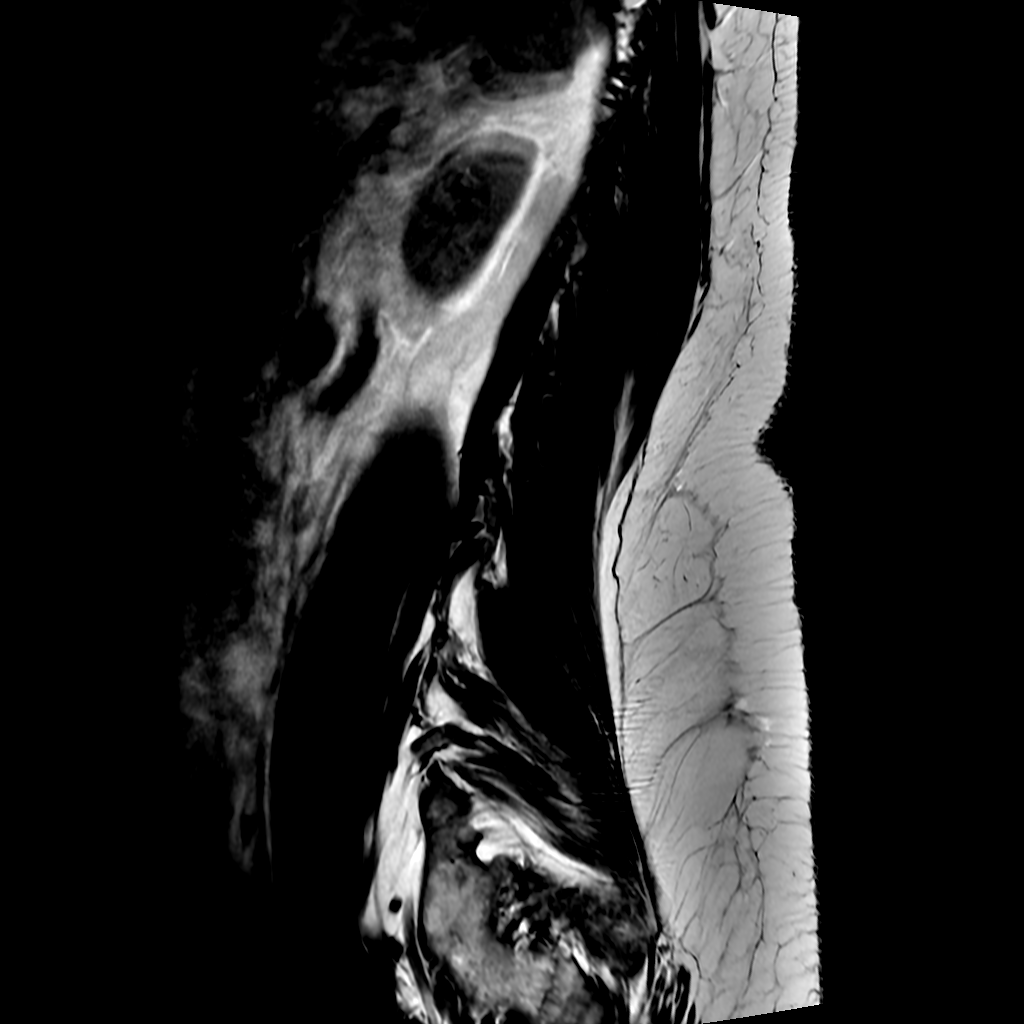
[im 9/17]
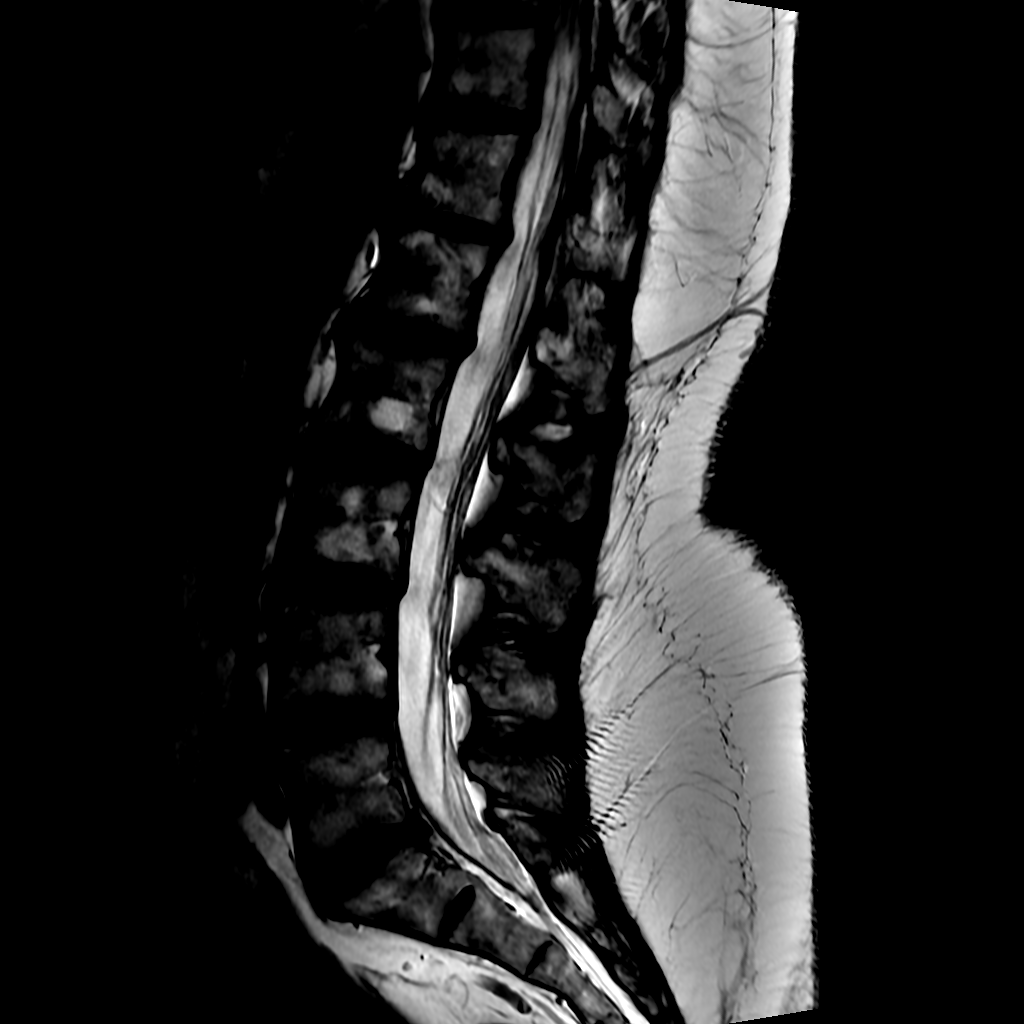
[im 17/17]
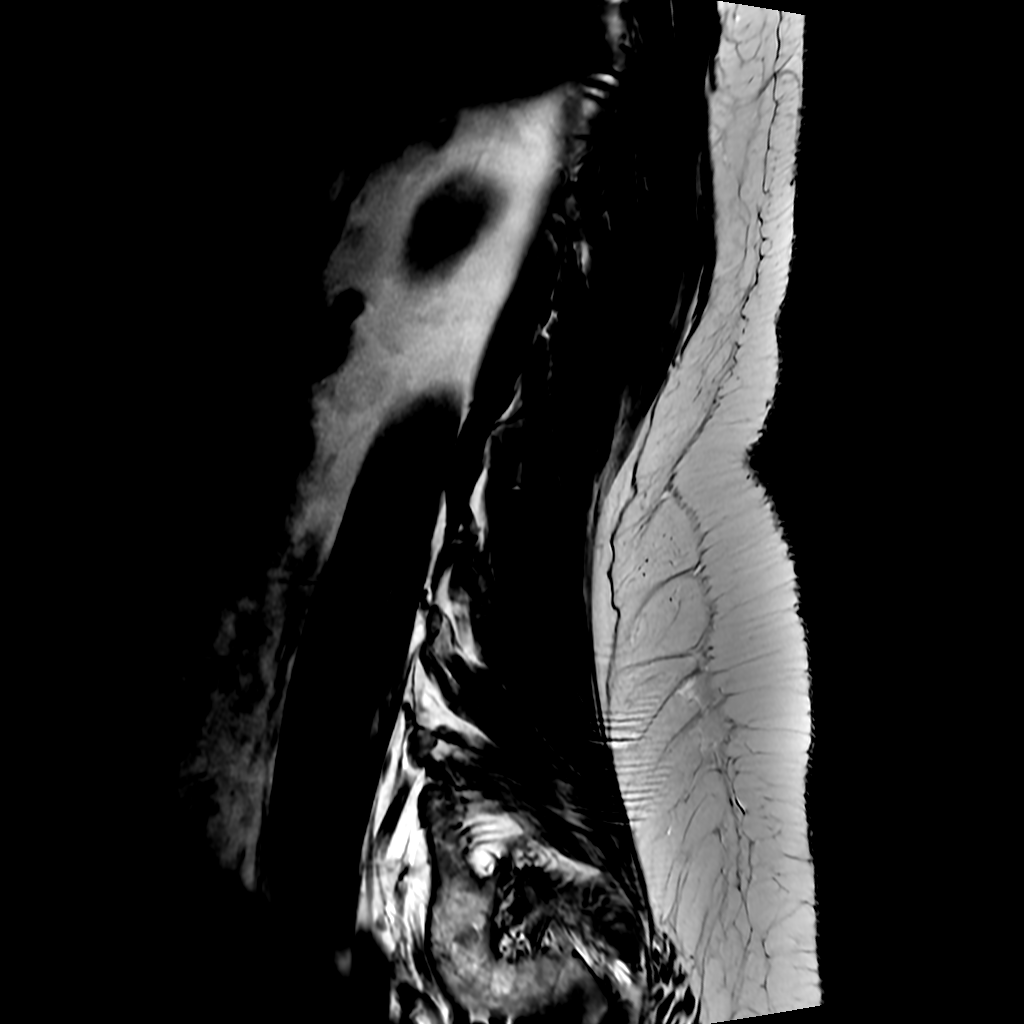

[Series 13: T2 · axial · 4.0mm · 0.20mm/px · z∈[-390,-338]mm · 2 of 30 slices shown (2 of 2)]
[im 5/30]
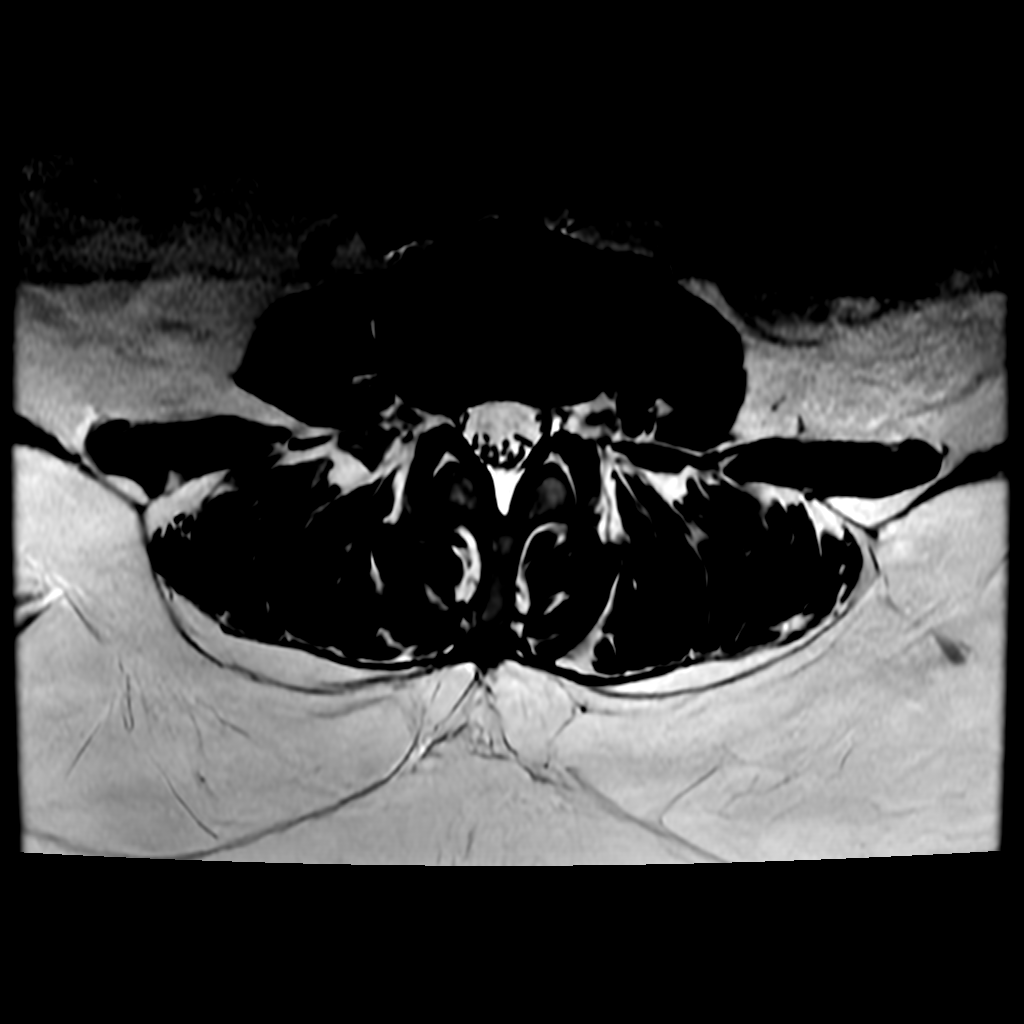
[im 17/30]
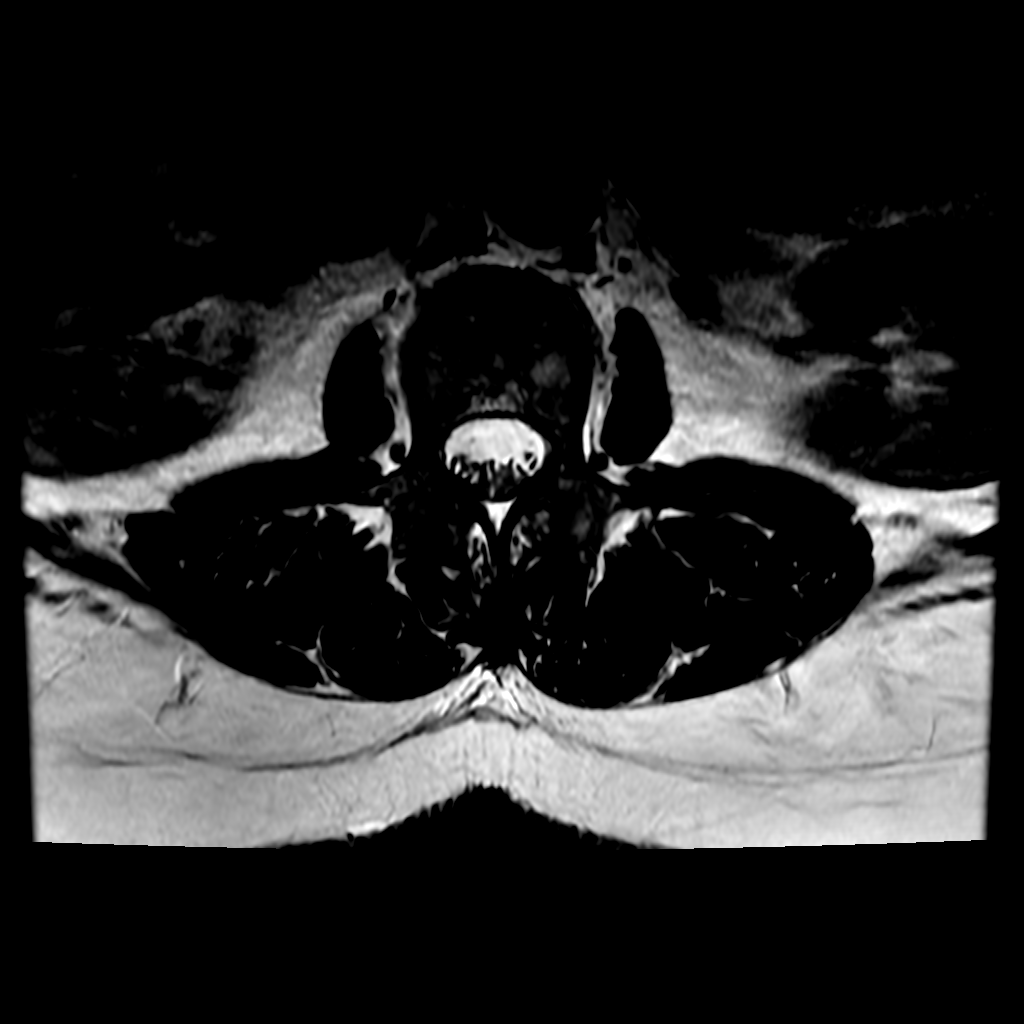

[5 of 48 positions shown; findings below may reference images not displayed]

FINDINGS: Mild motion artifact limits detail. 
Lumbar vertebral heights are intact. Disc heights are preserved. There is 
moderate narrowing at T12-L1. Conus terminates opposite T12-L1. There is no 
evidence for spinal malignancy. 
At L5-S1 there is a small midline bulge with component of extrusion encroaching 
on the ventral epidural fat, not producing deformity on the thecal sac. The 
canal and foramina are open. 
At L4-5 the disc margin is intact. The canal and foramina are open. Mild to 
moderate facet change. 
At L3-4 the canal is open. Mild right foraminal narrowing without nerve 
deformity. Left foramen open. Moderate facet change. 
The upper 2 lumbar levels show no significant stenosis. Minimal disc bulge at 
L1-2.
IMPRESSION: Lumbar spine degenerative changes. Small midline bulge/extrusion at L5-S1 does 
not produce neural deformity, but is potential source of discogenic pain. 
There is no significant lumbar canal stenosis. Mild right foraminal stenosis at 
L3-4. 
Mild Modic type I change along the anterior L3-4 and L4-5 interspace, also at
